# Patient Record
Sex: Female | Born: 1975 | Race: White | Hispanic: Yes | Marital: Married | Smoking: Never smoker
Health system: Southern US, Community
[De-identification: ages and names within clinical notes are randomized; demographics above are authoritative.]

## PROBLEM LIST (undated history)

## (undated) DIAGNOSIS — O24419 Gestational diabetes mellitus in pregnancy, unspecified control: Secondary | ICD-10-CM

---

## 1999-03-01 ENCOUNTER — Emergency Department (HOSPITAL_COMMUNITY): Admission: EM | Admit: 1999-03-01 | Discharge: 1999-03-01 | Payer: Self-pay | Admitting: Emergency Medicine

## 1999-03-27 ENCOUNTER — Other Ambulatory Visit: Admission: RE | Admit: 1999-03-27 | Discharge: 1999-03-27 | Payer: Self-pay | Admitting: Gynecology

## 1999-10-10 ENCOUNTER — Inpatient Hospital Stay (HOSPITAL_COMMUNITY): Admission: AD | Admit: 1999-10-10 | Discharge: 1999-10-10 | Payer: Self-pay | Admitting: *Deleted

## 1999-10-11 ENCOUNTER — Inpatient Hospital Stay (HOSPITAL_COMMUNITY): Admission: AD | Admit: 1999-10-11 | Discharge: 1999-10-11 | Payer: Self-pay | Admitting: *Deleted

## 1999-10-12 ENCOUNTER — Inpatient Hospital Stay (HOSPITAL_COMMUNITY): Admission: AD | Admit: 1999-10-12 | Discharge: 1999-10-12 | Payer: Self-pay | Admitting: *Deleted

## 1999-10-12 ENCOUNTER — Inpatient Hospital Stay (HOSPITAL_COMMUNITY): Admission: AD | Admit: 1999-10-12 | Discharge: 1999-10-14 | Payer: Self-pay | Admitting: Obstetrics

## 2002-02-15 ENCOUNTER — Encounter: Admission: RE | Admit: 2002-02-15 | Discharge: 2002-02-15 | Payer: Self-pay | Admitting: Obstetrics & Gynecology

## 2002-04-14 ENCOUNTER — Inpatient Hospital Stay (HOSPITAL_COMMUNITY): Admission: AD | Admit: 2002-04-14 | Discharge: 2002-04-16 | Payer: Self-pay | Admitting: Obstetrics and Gynecology

## 2008-03-30 ENCOUNTER — Inpatient Hospital Stay (HOSPITAL_COMMUNITY): Admission: AD | Admit: 2008-03-30 | Discharge: 2008-03-30 | Payer: Self-pay | Admitting: Obstetrics & Gynecology

## 2008-04-11 ENCOUNTER — Encounter: Payer: Self-pay | Admitting: Obstetrics & Gynecology

## 2008-04-11 ENCOUNTER — Ambulatory Visit (HOSPITAL_COMMUNITY): Admission: RE | Admit: 2008-04-11 | Discharge: 2008-04-11 | Payer: Self-pay | Admitting: Obstetrics & Gynecology

## 2010-08-27 NOTE — Op Note (Signed)
NAME:  Gabriela Pierce, Gabriela Pierce              ACCOUNT NO.:  [id number]   MEDICAL RECORD NO.:  [id number]          PATIENT TYPE:  AMB   LOCATION:  SDC                           FACILITY:  [facility]   PHYSICIAN:  Roseanna Rainbow, M.D.DATE OF BIRTH:  09/08/1975   DATE OF PROCEDURE:  04/11/2008  DATE OF DISCHARGE:                               OPERATIVE REPORT   PREOPERATIVE DIAGNOSIS:  Missed abortion.   POSTOPERATIVE DIAGNOSIS:  Missed abortion.   PROCEDURE:  Suction, dilatation, and curettage.   SURGEON:  Roseanna Rainbow, MD   ANESTHESIA:  Managed anesthesia care, paracervical block.   PATHOLOGY:  Products of conception.   ESTIMATED BLOOD LOSS:  Minimal.   COMPLICATIONS:  None.   PROCEDURE:  The patient was taken to the operating room with an IV  running.  She was placed in the dorsal lithotomy position and prepped  and draped in the usual sterile fashion.  After a time-out had been  completed, a sterile speculum was then placed.  The anterior lip of the  cervix was then infiltrated with 2 mL of 1% Xylocaine.  The anterior lip  of the cervix was then grasped with a single-tooth tenaculum.  A 4 mL of  1% Xylocaine were then injected at 4 and 7 o'clock to produce a  paracervical block.  The cervix was then dilated with [facility] dilators.  A  7-mm suction curette was then advanced into the uterus.  The device was  activated and rotated to evacuate the uterus of the products of  conception.  A sharp curettage was then performed.  A final pass was  then made with the suction curette.  The tenaculum was then removed with  minimal bleeding noted from the cervix.  At the close of the procedure,  the instrument and pack counts were said to be correct x2.  The patient  was taken to the PACU awake and in stable condition.      Roseanna Rainbow, M.D.  Electronically Signed     LAJ/MEDQ  D:  04/11/2008  T:  04/12/2008  Job:  [id number]

## 2010-10-29 LAB — RPR: RPR: NONREACTIVE

## 2010-10-29 LAB — GC/CHLAMYDIA PROBE AMP, GENITAL: Chlamydia: NEGATIVE

## 2010-10-29 LAB — ANTIBODY SCREEN: Antibody Screen: NEGATIVE

## 2010-10-29 LAB — HIV ANTIBODY (ROUTINE TESTING W REFLEX): HIV: NONREACTIVE

## 2011-01-16 LAB — CBC
MCHC: 34 g/dL (ref 30.0–36.0)
RBC: 4.88 MIL/uL (ref 3.87–5.11)
RDW: 12.9 % (ref 11.5–15.5)

## 2011-01-16 LAB — URINE MICROSCOPIC-ADD ON

## 2011-01-16 LAB — URINALYSIS, ROUTINE W REFLEX MICROSCOPIC
Bilirubin Urine: NEGATIVE
Glucose, UA: NEGATIVE mg/dL
Ketones, ur: NEGATIVE mg/dL
Protein, ur: NEGATIVE mg/dL
pH: 5.5 (ref 5.0–8.0)

## 2011-01-16 LAB — POCT PREGNANCY, URINE: Preg Test, Ur: POSITIVE

## 2011-01-16 LAB — ABO/RH: ABO/RH(D): O POS

## 2011-03-28 LAB — HIV ANTIBODY (ROUTINE TESTING W REFLEX): HIV: NONREACTIVE

## 2011-03-28 LAB — RPR: RPR: NONREACTIVE

## 2011-04-15 NOTE — L&D Delivery Note (Signed)
Delivery Note At 8:35 AM a viable female was delivered via Vaginal, Spontaneous Delivery (Presentation: Left Occiput Anterior).  APGAR: 9, 9; weight 7 lb 9.5 oz (3445 g).   Placenta status: Intact, Spontaneous.  Cord: 3 vessels with the following complications: Knot.  Cord pH: none  Anesthesia: Epidural  Episiotomy:  Lacerations:  Suture Repair: 2.0 chromic Est. Blood Loss (mL):   Mom to postpartum.  Baby to nursery-stable.  Uzziel Russey A 06/18/2011, 9:07 AM

## 2011-05-26 LAB — STREP B DNA PROBE: GBS: NEGATIVE

## 2011-06-18 ENCOUNTER — Encounter (HOSPITAL_COMMUNITY): Payer: Self-pay | Admitting: Anesthesiology

## 2011-06-18 ENCOUNTER — Encounter (HOSPITAL_COMMUNITY): Payer: Self-pay | Admitting: *Deleted

## 2011-06-18 ENCOUNTER — Inpatient Hospital Stay (HOSPITAL_COMMUNITY): Payer: Medicaid Other | Admitting: Anesthesiology

## 2011-06-18 ENCOUNTER — Inpatient Hospital Stay (HOSPITAL_COMMUNITY)
Admission: AD | Admit: 2011-06-18 | Discharge: 2011-06-20 | DRG: 775 | Disposition: A | Discharge status: Home Health | Payer: Medicaid Other | Source: Ambulatory Visit | Attending: Obstetrics | Admitting: Obstetrics

## 2011-06-18 DIAGNOSIS — O99814 Abnormal glucose complicating childbirth: Principal | ICD-10-CM | POA: Diagnosis present

## 2011-06-18 HISTORY — DX: Gestational diabetes mellitus in pregnancy, unspecified control: O24.419

## 2011-06-18 LAB — CBC
Hemoglobin: 11.3 g/dL — ABNORMAL LOW (ref 12.0–15.0)
MCH: 24 pg — ABNORMAL LOW (ref 26.0–34.0)
MCHC: 32.3 g/dL (ref 30.0–36.0)
Platelets: 239 10*3/uL (ref 150–400)
RBC: 4.7 MIL/uL (ref 3.87–5.11)

## 2011-06-18 LAB — RPR: RPR Ser Ql: NONREACTIVE

## 2011-06-18 MED ORDER — EPHEDRINE 5 MG/ML INJ
10.0000 mg | INTRAVENOUS | Status: DC | PRN
  Filled 2011-06-18: qty 4

## 2011-06-18 MED ORDER — OXYCODONE-ACETAMINOPHEN 5-325 MG PO TABS: 1.0000 | ORAL_TABLET | ORAL | Status: DC | PRN

## 2011-06-18 MED ORDER — LIDOCAINE HCL (PF) 1 % IJ SOLN
30.0000 mL | INTRAMUSCULAR | Status: DC | PRN
  Administered 2011-06-18: 30 mL via SUBCUTANEOUS
  Filled 2011-06-18: qty 30

## 2011-06-18 MED ORDER — OXYTOCIN 20 UNITS IN LACTATED RINGERS INFUSION - SIMPLE: 125.0000 mL/h | INTRAVENOUS | Status: DC | PRN

## 2011-06-18 MED ORDER — ZOLPIDEM TARTRATE 5 MG PO TABS: 5.0000 mg | ORAL_TABLET | Freq: Every evening | ORAL | Status: DC | PRN

## 2011-06-18 MED ORDER — BENZOCAINE-MENTHOL 20-0.5 % EX AERO
INHALATION_SPRAY | CUTANEOUS | Status: AC
  Filled 2011-06-18: qty 56

## 2011-06-18 MED ORDER — ACETAMINOPHEN 325 MG PO TABS: 650.0000 mg | ORAL_TABLET | ORAL | Status: DC | PRN

## 2011-06-18 MED ORDER — OXYTOCIN 20 UNITS IN LACTATED RINGERS INFUSION - SIMPLE
125.0000 mL/h | Freq: Once | INTRAVENOUS | Status: AC
  Administered 2011-06-18: 999 mL/h via INTRAVENOUS

## 2011-06-18 MED ORDER — LANOLIN HYDROUS EX OINT: TOPICAL_OINTMENT | CUTANEOUS | Status: DC | PRN

## 2011-06-18 MED ORDER — ONDANSETRON HCL 4 MG/2ML IJ SOLN: 4.0000 mg | Freq: Four times a day (QID) | INTRAMUSCULAR | Status: DC | PRN

## 2011-06-18 MED ORDER — ONDANSETRON HCL 4 MG/2ML IJ SOLN: 4.0000 mg | INTRAMUSCULAR | Status: DC | PRN

## 2011-06-18 MED ORDER — OXYTOCIN 10 UNIT/ML IJ SOLN
INTRAMUSCULAR | Status: AC
  Administered 2011-06-18: 20 [IU]
  Filled 2011-06-18: qty 2

## 2011-06-18 MED ORDER — OXYTOCIN 20 UNITS IN LACTATED RINGERS INFUSION - SIMPLE: 125.0000 mL/h | Freq: Once | INTRAVENOUS | Status: DC

## 2011-06-18 MED ORDER — PHENYLEPHRINE 40 MCG/ML (10ML) SYRINGE FOR IV PUSH (FOR BLOOD PRESSURE SUPPORT)
80.0000 ug | PREFILLED_SYRINGE | INTRAVENOUS | Status: DC | PRN
  Filled 2011-06-18: qty 5

## 2011-06-18 MED ORDER — FLEET ENEMA 7-19 GM/118ML RE ENEM: 1.0000 | ENEMA | RECTAL | Status: DC | PRN

## 2011-06-18 MED ORDER — TETANUS-DIPHTH-ACELL PERTUSSIS 5-2.5-18.5 LF-MCG/0.5 IM SUSP: 0.5000 mL | Freq: Once | INTRAMUSCULAR | Status: DC

## 2011-06-18 MED ORDER — SENNOSIDES-DOCUSATE SODIUM 8.6-50 MG PO TABS
2.0000 | ORAL_TABLET | Freq: Every day | ORAL | Status: DC
  Administered 2011-06-18 – 2011-06-19 (×2): 2 via ORAL

## 2011-06-18 MED ORDER — DIPHENHYDRAMINE HCL 50 MG/ML IJ SOLN: 12.5000 mg | INTRAMUSCULAR | Status: DC | PRN

## 2011-06-18 MED ORDER — IBUPROFEN 600 MG PO TABS: 600.0000 mg | ORAL_TABLET | Freq: Four times a day (QID) | ORAL | Status: DC | PRN

## 2011-06-18 MED ORDER — FENTANYL 2.5 MCG/ML BUPIVACAINE 1/10 % EPIDURAL INFUSION (WH - ANES)
INTRAMUSCULAR | Status: DC | PRN
  Administered 2011-06-18: 13 mL/h via EPIDURAL

## 2011-06-18 MED ORDER — SIMETHICONE 80 MG PO CHEW: 80.0000 mg | CHEWABLE_TABLET | ORAL | Status: DC | PRN

## 2011-06-18 MED ORDER — CITRIC ACID-SODIUM CITRATE 334-500 MG/5ML PO SOLN: 30.0000 mL | ORAL | Status: DC | PRN

## 2011-06-18 MED ORDER — EPHEDRINE 5 MG/ML INJ: 10.0000 mg | INTRAVENOUS | Status: DC | PRN

## 2011-06-18 MED ORDER — DIBUCAINE 1 % RE OINT: 1.0000 "application " | TOPICAL_OINTMENT | RECTAL | Status: DC | PRN

## 2011-06-18 MED ORDER — MEDROXYPROGESTERONE ACETATE 150 MG/ML IM SUSP: 150.0000 mg | INTRAMUSCULAR | Status: DC | PRN

## 2011-06-18 MED ORDER — WITCH HAZEL-GLYCERIN EX PADS: 1.0000 "application " | MEDICATED_PAD | CUTANEOUS | Status: DC | PRN

## 2011-06-18 MED ORDER — FENTANYL 2.5 MCG/ML BUPIVACAINE 1/10 % EPIDURAL INFUSION (WH - ANES)
14.0000 mL/h | INTRAMUSCULAR | Status: DC
  Filled 2011-06-18: qty 60

## 2011-06-18 MED ORDER — PHENYLEPHRINE 40 MCG/ML (10ML) SYRINGE FOR IV PUSH (FOR BLOOD PRESSURE SUPPORT): 80.0000 ug | PREFILLED_SYRINGE | INTRAVENOUS | Status: DC | PRN

## 2011-06-18 MED ORDER — DIPHENHYDRAMINE HCL 25 MG PO CAPS: 25.0000 mg | ORAL_CAPSULE | Freq: Four times a day (QID) | ORAL | Status: DC | PRN

## 2011-06-18 MED ORDER — PRENATAL MULTIVITAMIN CH
1.0000 | ORAL_TABLET | Freq: Every day | ORAL | Status: DC
  Administered 2011-06-19 – 2011-06-20 (×2): 1 via ORAL
  Filled 2011-06-18 (×2): qty 1

## 2011-06-18 MED ORDER — IBUPROFEN 600 MG PO TABS
600.0000 mg | ORAL_TABLET | Freq: Four times a day (QID) | ORAL | Status: DC
  Administered 2011-06-18 – 2011-06-20 (×8): 600 mg via ORAL
  Filled 2011-06-18 (×8): qty 1

## 2011-06-18 MED ORDER — OXYTOCIN BOLUS FROM INFUSION
500.0000 mL | Freq: Once | INTRAVENOUS | Status: DC
  Filled 2011-06-18: qty 1000
  Filled 2011-06-18: qty 500

## 2011-06-18 MED ORDER — LIDOCAINE HCL (PF) 1 % IJ SOLN
INTRAMUSCULAR | Status: DC | PRN
  Administered 2011-06-18: 4 mL
  Administered 2011-06-18: 3 mL

## 2011-06-18 MED ORDER — LACTATED RINGERS IV SOLN
500.0000 mL | INTRAVENOUS | Status: DC | PRN
  Administered 2011-06-18: 500 mL via INTRAVENOUS

## 2011-06-18 MED ORDER — METHYLERGONOVINE MALEATE 0.2 MG/ML IJ SOLN: 0.2000 mg | INTRAMUSCULAR | Status: DC | PRN

## 2011-06-18 MED ORDER — ONDANSETRON HCL 4 MG PO TABS: 4.0000 mg | ORAL_TABLET | ORAL | Status: DC | PRN

## 2011-06-18 MED ORDER — BENZOCAINE-MENTHOL 20-0.5 % EX AERO: 1.0000 "application " | INHALATION_SPRAY | CUTANEOUS | Status: DC | PRN

## 2011-06-18 MED ORDER — OXYCODONE-ACETAMINOPHEN 5-325 MG PO TABS
1.0000 | ORAL_TABLET | ORAL | Status: DC | PRN
  Administered 2011-06-18 – 2011-06-20 (×5): 1 via ORAL
  Filled 2011-06-18 (×5): qty 1

## 2011-06-18 MED ORDER — LACTATED RINGERS IV SOLN
INTRAVENOUS | Status: DC
  Administered 2011-06-18: 06:00:00 via INTRAVENOUS
  Administered 2011-06-18: 999 mL/h via INTRAVENOUS

## 2011-06-18 MED ORDER — METHYLERGONOVINE MALEATE 0.2 MG PO TABS: 0.2000 mg | ORAL_TABLET | ORAL | Status: DC | PRN

## 2011-06-18 MED ORDER — LACTATED RINGERS IV SOLN: 500.0000 mL | Freq: Once | INTRAVENOUS | Status: DC

## 2011-06-18 NOTE — Progress Notes (Signed)
Pt states she had a gush of clear fluid at 0415 and then started contracting

## 2011-06-18 NOTE — Anesthesia Postprocedure Evaluation (Signed)
  Anesthesia Post-op Note  Patient: Gabriela Pierce  Procedure(s) Performed: * Lumbar Epidural for L&D *  Patient Location: Mother/Baby  Anesthesia Type: Epidural  Level of Consciousness: awake, alert  and oriented  Airway and Oxygen Therapy: Patient Spontanous Breathing  Post-op Pain: none  Post-op Assessment: Post-op Vital signs reviewed, Patient's Cardiovascular Status Stable, Respiratory Function Stable, Patent Airway, No signs of Nausea or vomiting, Adequate PO intake, Pain level controlled, No headache and No backache  Post-op Vital Signs: Reviewed and stable  Complications: No apparent anesthesia complications

## 2011-06-18 NOTE — Anesthesia Preprocedure Evaluation (Signed)
Anesthesia Evaluation  Patient identified by MRN, date of birth, ID band Patient awake    Reviewed: Allergy & Precautions, H&P , Patient's Chart, lab work & pertinent test results  Airway Mallampati: III TM Distance: >3 FB Neck ROM: full    Dental No notable dental hx. (+) Teeth Intact   Pulmonary neg pulmonary ROS,  breath sounds clear to auscultation  Pulmonary exam normal       Cardiovascular negative cardio ROS  Rhythm:regular Rate:Normal     Neuro/Psych negative neurological ROS  negative psych ROS   GI/Hepatic negative GI ROS, Neg liver ROS,   Endo/Other  Diabetes mellitus-, Well Controlled, Gestational  Renal/GU negative Renal ROS  negative genitourinary   Musculoskeletal   Abdominal Normal abdominal exam  (+)   Peds  Hematology negative hematology ROS (+)   Anesthesia Other Findings   Reproductive/Obstetrics (+) Pregnancy                           Anesthesia Physical Anesthesia Plan  ASA: II  Anesthesia Plan: Epidural   Post-op Pain Management:    Induction:   Airway Management Planned:   Additional Equipment:   Intra-op Plan:   Post-operative Plan:   Informed Consent: I have reviewed the patients History and Physical, chart, labs and discussed the procedure including the risks, benefits and alternatives for the proposed anesthesia with the patient or authorized representative who has indicated his/her understanding and acceptance.     Plan Discussed with: Anesthesiologist and Surgeon  Anesthesia Plan Comments:         Anesthesia Quick Evaluation

## 2011-06-18 NOTE — H&P (Signed)
Gabriela Pierce is a 36 y.o. female presenting for SROM and UC's. Maternal Medical History:  Reason for admission: Reason for admission: rupture of membranes and contractions.  Contractions: Onset was 3-5 hours ago.   Frequency: regular.    Fetal activity: Perceived fetal activity is normal.   Last perceived fetal movement was within the past hour.    Prenatal complications: no prenatal complications   OB History    Grav Para Term Preterm Abortions TAB SAB Ect Mult Living   4 2   1  1   2      Past Medical History  Diagnosis Date  . Gestational diabetes    History reviewed. No pertinent past surgical history. Family History: family history includes Anesthesia problems in her sister. Social History:  reports that she has never smoked. She has never used smokeless tobacco. She reports that she does not drink alcohol or use illicit drugs.  Review of Systems  All other systems reviewed and are negative.    Dilation: 10 Effacement (%): 100 Station: 0 Exam by:: r. gagnonr,c Blood pressure 113/75, pulse 68, temperature 97.7 F (36.5 C), temperature source Oral, resp. rate 20, height 5\' 2"  (1.575 m), weight 150 lb (68.04 kg), SpO2 98.00%. Maternal Exam:  Uterine Assessment: Contraction strength is firm.  Contraction duration is 1 minute. Abdomen: Patient reports no abdominal tenderness. Fetal presentation: vertex  Introitus: Normal vulva. Normal vagina.    Physical Exam  Nursing note and vitals reviewed. Constitutional: She is oriented to person, place, and time. She appears well-developed and well-nourished.  Eyes: Pupils are equal, round, and reactive to light.  Neck: Normal range of motion. Neck supple.  Cardiovascular: Normal rate and regular rhythm.   Respiratory: Effort normal.  GI: Soft.  Genitourinary: Vagina normal and uterus normal.  Musculoskeletal: Normal range of motion.  Neurological: She is alert and oriented to person, place, and time.  Skin: Skin is  warm and dry.  Psychiatric: She has a normal mood and affect. Her behavior is normal. Judgment and thought content normal.    Prenatal labs: ABO, Rh: O/Positive/-- (07/17 0000) Antibody: Negative (07/17 0000) Rubella:   RPR: Nonreactive (12/14 0000)  HBsAg: Negative (07/17 0000)  HIV: Non-reactive (12/14 0000)  GBS: Negative (02/11 0000)   Assessment/Plan: 39 weeks.  Active labor.  Expectant management.   Gabriela Pierce A 06/18/2011, 8:12 AM

## 2011-06-18 NOTE — Anesthesia Procedure Notes (Signed)
Epidural Patient location during procedure: OB Start time: 06/18/2011 6:42 AM  Staffing Anesthesiologist: Malen Gauze, Murray Durrell A. Performed by: anesthesiologist   Preanesthetic Checklist Completed: patient identified, site marked, surgical consent, pre-op evaluation, timeout performed, IV checked, risks and benefits discussed and monitors and equipment checked  Epidural Patient position: sitting Prep: site prepped and draped and DuraPrep Patient monitoring: continuous pulse ox and blood pressure Approach: midline Injection technique: LOR air  Needle:  Needle type: Tuohy  Needle gauge: 17 G Needle length: 9 cm Needle insertion depth: 5 cm cm Catheter type: closed end flexible Catheter size: 19 Gauge Catheter at skin depth: 10 cm Test dose: negative  Assessment Events: blood not aspirated, injection not painful, no injection resistance, negative IV test and no paresthesia

## 2011-06-18 NOTE — Progress Notes (Signed)
UR chart review completed.  

## 2011-06-18 NOTE — Progress Notes (Signed)
Gabriela Pierce is a 36 y.o. [phone] at [redacted]w[redacted]d by LMP admitted for active labor  Subjective:   Objective: BP 113/75  Pulse 68  Temp(Src) 97.7 F (36.5 C) (Oral)  Resp 20  Ht 5\' 2"  (1.575 m)  Wt 150 lb (68.04 kg)  BMI 27.44 kg/m2  SpO2 98%   Total I/O In: -  Out: 100 [Urine:100]  FHT:  FHR: 150 bpm, variability: moderate,  accelerations:  Present,  decelerations:  Absent UC:   regular, every 3 minutes SVE:   Dilation: 10 Effacement (%): 100 Station: 0 Exam by:: r. gagnonr,c  Labs: Lab Results  Component Value Date   WBC 5.0 06/18/2011   HGB 11.3* 06/18/2011   HCT 35.0* 06/18/2011   MCV 74.5* 06/18/2011   PLT 239 06/18/2011    Assessment / Plan: Spontaneous labor, progressing normally  Labor: Progressing normally Preeclampsia:  n/a Fetal Wellbeing:  Category I Pain Control:  Epidural I/D:  n/a Anticipated MOD:  NSVD  Gabriela Pierce A 06/18/2011, 8:16 AM

## 2011-06-19 LAB — CBC
HCT: 33.8 % — ABNORMAL LOW (ref 36.0–46.0)
Hemoglobin: 10.8 g/dL — ABNORMAL LOW (ref 12.0–15.0)
MCHC: 32 g/dL (ref 30.0–36.0)
RBC: 4.52 MIL/uL (ref 3.87–5.11)

## 2011-06-19 NOTE — Progress Notes (Signed)
Post Partum Day 1 Subjective: no complaints  Objective: Blood pressure 111/75, pulse 71, temperature 97.6 F (36.4 C), temperature source Oral, resp. rate 20, height 5\' 2"  (1.575 m), weight 150 lb (68.04 kg), SpO2 100.00%, unknown if currently breastfeeding.  Physical Exam:  General: alert and no distress Lochia: appropriate Uterine Fundus: firm Incision: healing well DVT Evaluation: No evidence of DVT seen on physical exam.   Basename 06/19/11 0511 06/18/11 0545  HGB 10.8* 11.3*  HCT 33.8* 35.0*    Assessment/Plan: Plan for discharge tomorrow   LOS: 1 day   Preslynn Bier A 06/19/2011, 8:58 AM

## 2011-06-20 MED ORDER — MEDROXYPROGESTERONE ACETATE 150 MG/ML IM SUSP: 150.0000 mg | INTRAMUSCULAR | Status: DC

## 2011-06-20 MED ORDER — IBUPROFEN 600 MG PO TABS: 600.0000 mg | ORAL_TABLET | Freq: Four times a day (QID) | ORAL | Status: DC

## 2011-06-20 MED ORDER — OXYCODONE-ACETAMINOPHEN 5-325 MG PO TABS: 1.0000 | ORAL_TABLET | ORAL | Status: AC | PRN

## 2011-06-20 NOTE — Discharge Summary (Signed)
Obstetric Discharge Summary Reason for Admission: onset of labor Prenatal Procedures: ultrasound Intrapartum Procedures: spontaneous vaginal delivery Postpartum Procedures: none Complications-Operative and Postpartum: none Hemoglobin  Date Value Range Status  06/19/2011 10.8* 12.0-15.0 (g/dL) Final     HCT  Date Value Range Status  06/19/2011 33.8* 36.0-46.0 (%) Final    Discharge Diagnoses: Term Pregnancy-delivered  Discharge Information: Date: 06/20/2011 Activity: pelvic rest Diet: routine Medications: None Condition: stable Instructions: refer to practice specific booklet Discharge to: home Follow-up Information    Follow up with Cledis Sohn A, MD. Schedule an appointment as soon as possible for a visit in 6 weeks.   Contact information:   [address] [place] [address] [phone]          Newborn Data: Live born female  Birth Weight: 7 lb 9.5 oz (3445 g) APGAR: 9, 9  Home with mother.  Edom Schmuhl A 06/20/2011, 9:16 AM

## 2011-06-20 NOTE — Progress Notes (Signed)
Post Partum Day 2 Subjective: no complaints  Objective: Blood pressure 103/67, pulse 64, temperature 97.4 F (36.3 C), temperature source Oral, resp. rate 16, height 5\' 2"  (1.575 m), weight 150 lb (68.04 kg), SpO2 100.00%, unknown if currently breastfeeding.  Physical Exam:  General: alert and no distress Lochia: appropriate Uterine Fundus: firm Incision: healing well DVT Evaluation: No evidence of DVT seen on physical exam.   Basename 06/19/11 0511 06/18/11 0545  HGB 10.8* 11.3*  HCT 33.8* 35.0*    Assessment/Plan: Discharge home   LOS: 2 days   Mykael Trott A 06/20/2011, 9:11 AM

## 2013-04-27 ENCOUNTER — Ambulatory Visit: Payer: Medicaid Other | Attending: Internal Medicine | Admitting: Internal Medicine

## 2013-04-27 ENCOUNTER — Encounter: Payer: Self-pay | Admitting: Internal Medicine

## 2013-04-27 VITALS — BP 112/73 | HR 76 | Temp 98.6°F | Resp 16 | Ht 60.0 in | Wt 135.0 lb

## 2013-04-27 DIAGNOSIS — Z0000 Encounter for general adult medical examination without abnormal findings: Secondary | ICD-10-CM | POA: Insufficient documentation

## 2013-04-27 DIAGNOSIS — Z23 Encounter for immunization: Secondary | ICD-10-CM

## 2013-04-27 NOTE — Progress Notes (Signed)
Pt is here to establish care.  Pt is requesting a physical and a pap smear.  

## 2013-04-27 NOTE — Progress Notes (Signed)
Patient ID: Gabriela Pierce, female   DOB: [birth year], 38 y.o.   MRN: [id number]  CC: New patient  HPI: 38 year old female with no significant past medical history who presented to clinic needing a referral for GYN and dentist. She has no complaints of chest pain or shortness of breath. No abdominal pain. No fever or chills.  No Known Allergies Past Medical History  Diagnosis Date  . Gestational diabetes    Current Outpatient Prescriptions on File Prior to Visit  Medication Sig Dispense Refill  . ibuprofen (ADVIL,MOTRIN) 600 MG tablet Take 1 tablet (600 mg total) by mouth every 6 (six) hours.  30 tablet  5  . medroxyPROGESTERone (DEPO-PROVERA) 150 MG/ML injection Inject 1 mL (150 mg total) into the muscle every 3 (three) months.  1 mL  3  . Prenatal Vit-Fe Fumarate-FA (PRENATAL MULTIVITAMIN) TABS Take 1 tablet by mouth daily.       No current facility-administered medications on file prior to visit.   Family History  Problem Relation Age of Onset  . Anesthesia problems Sister    History   Social History  . Marital Status: Married    Spouse Name: N/A    Number of Children: N/A  . Years of Education: N/A   Occupational History  . Not on file.   Social History Main Topics  . Smoking status: Never Smoker   . Smokeless tobacco: Never Used  . Alcohol Use: 0.0 oz/week    .5 drink(s) per week  . Drug Use: No  . Sexual Activity: Yes   Other Topics Concern  . Not on file   Social History Narrative  . No narrative on file    Review of Systems  Constitutional: Negative for fever, chills, diaphoresis, activity change, appetite change and fatigue.  HENT: Negative for ear pain, nosebleeds, congestion, facial swelling, rhinorrhea, neck pain, neck stiffness and ear discharge.   Eyes: Negative for pain, discharge, redness, itching and visual disturbance.  Respiratory: Negative for cough, choking, chest tightness, shortness of breath, wheezing and stridor.   Cardiovascular:  Negative for chest pain, palpitations and leg swelling.  Gastrointestinal: Negative for abdominal distention.  Genitourinary: Negative for dysuria, urgency, frequency, hematuria, flank pain, decreased urine volume, difficulty urinating and dyspareunia.  Musculoskeletal: Negative for back pain, joint swelling, arthralgias and gait problem.  Neurological: Negative for dizziness, tremors, seizures, syncope, facial asymmetry, speech difficulty, weakness, light-headedness, numbness and headaches.  Hematological: Negative for adenopathy. Does not bruise/bleed easily.  Psychiatric/Behavioral: Negative for hallucinations, behavioral problems, confusion, dysphoric mood, decreased concentration and agitation.    Objective:   Filed Vitals:   04/27/13 1632  BP: 112/73  Pulse: 76  Temp: 98.6 F (37 C)  Resp: 16    Physical Exam  Constitutional: Appears well-developed and well-nourished. No distress.  HENT: Normocephalic. External right and left ear normal. Oropharynx is clear and moist.  Eyes: Conjunctivae and EOM are normal. PERRLA, no scleral icterus.  Neck: Normal ROM. Neck supple. No JVD. No tracheal deviation. No thyromegaly.  CVS: RRR, S1/S2 +, no murmurs, no gallops, no carotid bruit.  Pulmonary: Effort and breath sounds normal, no stridor, rhonchi, wheezes, rales.  Abdominal: Soft. BS +,  no distension, tenderness, rebound or guarding.  Musculoskeletal: Normal range of motion. No edema and no tenderness.  Lymphadenopathy: No lymphadenopathy noted, cervical, inguinal. Neuro: Alert. Normal reflexes, muscle tone coordination. No cranial nerve deficit. Skin: Skin is warm and dry. No rash noted. Not diaphoretic. No erythema. No pallor.  Psychiatric: Normal mood and affect.  Behavior, judgment, thought content normal.   Lab Results  Component Value Date   WBC 7.5 06/19/2011   HGB 10.8* 06/19/2011   HCT 33.8* 06/19/2011   MCV 74.8* 06/19/2011   PLT 246 06/19/2011   No results found for this  basename: CREATININE, BUN, NA, K, CL, CO2    No results found for this basename: HGBA1C   Lipid Panel  No results found for this basename: chol, trig, hdl, cholhdl, vldl, ldlcalc       Assessment and plan:   Preventive health care - Referral provided to GYN and dentist

## 2013-04-27 NOTE — Patient Instructions (Signed)
Cuidados preventivos en las mujeres adultas  (Preventive Care for Adults, Female)  Un estilo de vida saludable y los cuidados preventivos pueden favorecer la salud y el bienestar. Las pautas de salud preventivas para las mujeres incluyen las siguientes prácticas clave:  · Un examen físico de rutina anual es un buen modo de controlar su salud y realizar estudios preventivos. Le da la posibilidad de compartir preocupaciones y conocer el estado de su salud, y que le realicen estudios completos.  · Consulte al dentista para realizar un examen de rutina y cuidados preventivos cada seis meses. Cepíllese los dientes al menos dos veces por día y pásese el hilo dental al menos una vez por día. Una buena higiene bucal evita caries y enfermedades de las encías.  · La frecuencia con que debe hacerse exámenes de la vista depende de la edad, el estado de salud, la historia familiar, el uso de lentes de contacto y otros factores. Siga las recomendaciones del médico para saber con qué frecuencia debe hacerse exámenes de la vista.  · Consuma una dieta saludable. Los alimentos que contienen vegetales, las frutas, los granos enteros, los productos lácteos bajos en grasas y las proteínas magras contienen nutrientes que son necesarios, sin consumir demasiadas calorías. Disminuya la ingesta de alimentos ricos en grasas sólidas, azúcares y sal agregadas. Consuma la cantidad de calorías adecuada para usted. Si es necesario, pídale información acerca de una dieta adecuada a su médico.  · La actividad física regular es una de las cosas más importantes que puede hacer por su salud. Los adultos deben hacer al menos 150 minutos de ejercicios de intensidad moderada (cualquier actividad que aumente la frecuencia cardíaca y lo haga transpirar) cada semana. Además, la mayoría de los adultos necesita ejercicios de fortalecimiento muscular dos o más días por semana.  · Mantenga un peso saludable. El índice de masa corporal (IMC) es una herramienta  que identifica posibles problemas con el peso. Proporciona una estimación de la grasa corporal basándose en el peso y la altura. El médico podrá determinar su IMC y ayudarlo a lograr o mantener un peso saludable. Para los adultos de 20 años o más:  · Un IMC menor de 18,5 se considera bajo peso.  · Un IMC entre 18,5 y 24,9 es normal.  · Un IMC entre 25 y 29,9 es sobrepeso.  · Un IMC de 30 o más se considera obesidad.  · Mantenga un nivel normal de lípidos y colesterol en sangre practicando actividad física y minimizando la ingesta de grasas saturadas. Consuma una dieta balanceada y saludable, e incluya variedad de frutas y vegetales. Los análisis de lípidos y colesterol en sangre deben comenzar a los 20 años y repetirse cada 5 años. Si los niveles de colesterol son altos, tiene más de 50 años o tiene riesgo elevado de sufrir enfermedades cardíacas, necesitará controlarse con más frecuencia. Si tiene niveles elevados de lípidos y colesterol, debe recibir tratamiento con medicamentos, si la dieta y el ejercicio no están funcionando.  · Si fuma, consulte con el médico acerca de las opciones para dejar de hacerlo. Si no fuma, no comience.  · Se recomienda realizarse exámenes de detección de cáncer de pulmón a personas adultas entre 55 y 80 años que estén en riesgo de desarrollar cáncer de pulmón por sus antecedentes de consumo de tabaco. Para quienes hayan fumado durante 30 años un paquete diario, y sigan fumando o hayan dejado el hábito en algún momento en los últimos 15 años, se recomienda realizarse una tomografía   computada de baja dosis de los pulmones todos los años. Fumar durante un año-paquete equivale a fumar un promedio de un paquete de cigarrillos diario durante un año (por ejemplo: un paquete por día durante 30 años o dos paquetes por día durante 15 años). Los exámenes anuales deben continuar hasta que el fumador haya dejado de fumar durante un mínimo de 15 años. Ya no deben realizarse en personas que tengan  un problema de salud que les impida recibir tratamiento para el cáncer de pulmón.  · Si está embarazada, no beba alcohol. Si está amamantando, beba alcohol con prudencia. Si no está embarazada y decide beber alcohol, no beba más de una medida por día. Se considera una medida a 12 onzas (355 ml) de cerveza, 5 onzas (148 ml) de vino, o 1,5 onzas (44 ml) de licor.  · Evite el consumo de drogas. No comparta agujas. Pida ayuda si necesita asistencia o instrucciones con respecto a abandonar el consumo de drogas.  · La hipertensión arterial causa enfermedades cardíacas y aumenta el riesgo de ictus. Debe controlar su presión arterial al menos cada uno o dos años. La hipertensión arterial que persiste debe tratarse con medicamentos si la pérdida de peso y el ejercicio no son efectivos.  · Si tiene entre 55 y 79 años, consulte a su médico si debe tomar aspirina para prevenir ictus.  · Los análisis para la diabetes incluyen la toma de una muestra de sangre para controlar el nivel de azúcar en la sangre durante el ayuno. Debe hacerlo cada tres años después de los 45 años si está dentro de su peso normal y sin factores de riesgo para la diabetes. Las pruebas deben comenzar a edades tempranas o llevarse a cabo con más frecuencia si tiene sobrepeso y al menos un factor de riesgo para la diabetes.  · Las evaluaciones para detectar el cáncer de mama son un método preventivo fundamental para las mujeres. Debe practicar la "autoconciencia de las mamas". Esto significa que debe reconocer la apariencia normal de sus mamas y como las siente y pudiendo incluir un autoexamen de mamas. Si detecta algún cambio, no importa cuán pequeño sea, debe informarlo a su médico. Las mujeres entre 20 y 30 años deben hacerse un examen clínico de las mamas como parte del examen regular de salud, cada uno a tres años. Después de los 40 años, deben hacerlo todos los años. A partir de los 40 años, deben hacerse una mamografía (radiografía de mamas) cada año.  Las mujeres con antecedentes familiares de cáncer de mama deben hablar con el médico para someterse a un estudio genético. Las que tienen más riesgo deben hacerse una resonancia magnética y una mamografía todos los años.  · La evaluación del riesgo de cáncer relacionado con el gen del cáncer de mama (BRCA) se recomienda a mujeres que tengan familiares con cáncer relacionado con el BRCA. Los cánceres relacionados con el BRCA incluyen el cáncer de mama, de ovario, de trompas y peritoneal. Tener familiares con estos cánceres puede estar asociado con un mayor riesgo de cambios dañinos (mutaciones) en los genes del cáncer de mama BRCA1 y BRCA2. Los resultados de la evaluación determinarán la necesidad de asesoramiento genético y de análisis de BRCA1 y BRCA2.  · Una prueba de Papanicolaou se realiza para diagnosticar cáncer de cuello del útero. Muestra los cambios celulares en el cuello del útero que pueden transformarse en cáncer si no se tratan. La prueba de Papanicolaou es un procedimiento por el que se obtienen células de la parte inferior del útero (cuello   del útero) y son examinadas.  · Las mujeres deben hacerse una prueba de Papanicolaou a partir de los 21 años.  · Entre los 21 y los 29 años debe repetirse cada dos años.  · Después de los 30 años, debe realizarse una prueba de Papanicolaou cada tres años siempre que los tres estudios anteriores sean normales.  · Algunas mujeres sufren problemas médicos que aumentan la probabilidad de contraer cáncer de cuello del útero. Hable con su médico sobre estos problemas. Es muy importante que le informe a su médico si aparecen nuevos problemas poco después de su última prueba de Papanicolaou. En estos casos, el médico podrá indicar que se realice esta prueba con más frecuencia.  · Estas recomendaciones son las mismas para todas las mujeres que hayan recibido o no la vacuna para el VPH (virus del papiloma humano).  · Si le han realizado una histerectomía por un problema que  no era cáncer u otra enfermedad que podría causar cáncer, ya no necesitará la prueba de Papanicolaou. Sin embargo, es una buena idea hacerse un examen regularmente para asegurarse de que no haya otros problemas.  · Si tiene entre 65 y 70 años y ha tenido un resultado normal en la prueba de Papanicolaou en los últimos 10 años, no será más necesario realizarla. Sin embargo, es una buena idea hacerse un examen regularmente para asegurarse de que no haya otros problemas.  · Si ha recibido un tratamiento para el cáncer cervical o para una enfermedad que podría causar cáncer, necesitará realizarse una prueba de Papanicolaou y controles durante al menos 20 años de concluido el tratamiento.  · Si no se ha hecho el examen con regularidad, deberán volver a evaluarse los factores de riesgo (como el tener un nuevo compañero sexual) para determinar si debe volver a realizarse los estudios.  · La prueba del VPH es un análisis adicional que puede usarse para detectar cáncer de cuello del útero. Esta prueba busca la presencia del virus que causa los cambios en el cuello. Las células que se recolectan durante la prueba de Papanicolaou pueden usarse para el VPH. La prueba para el VPH puede usarse para evaluar a mujeres de más de 30 años y debe usarse en mujeres de cualquier edad cuyos resultados de la prueba de Papanicolaou no sean claros. Después de los 30 años, las mujeres deben hacerse el análisis para el VPH con la misma frecuencia que la prueba de Papanicolaou.  · El cáncer colorectal puede detectarse y con frecuencia puede prevenirse. La mayor parte de los estudios de rutina comienzan a los 50 años y continúan hasta los 75 años. Sin embargo, el médico podrá aconsejarle que lo haga antes, si tiene factores de riesgo para el cáncer de colon. Una vez por año, el profesional le dará un kit de prueba para hallar sangre oculta en la materia fecal. La utilización de un tubo con una pequeña cámara en su extremo para examinar  directamente el colon (sigmoidoscopía o colonoscopía), puede detectar formas temprana de cáncer colorectal. Hable con su médico si tiene 50 años, edad a la que comienzan los estudios de rutina. El examen directo del colon debe repetirse cada cinco a 10 años, hasta los 75 años, excepto que se encuentren formas tempranas de pólipos precancerosos o pequeños bultos.  · Las personas con un riesgo mayor de hepatitis B deben realizarse análisis para detectar el virus. Se considera que tiene un alto riesgo de hepatitis B si:  · Nació en un país donde la hepatitis   B es frecuente. Pregúntele a su médico qué países son considerados de alto riesgo.  · Sus padres nacieron en un país de alto riesgo y usted no recibió una vacuna que proteja contra la hepatitis B (vacuna de la hepatitis B).  · Tiene VIH o SIDA.  · Usa agujas para inyectarse drogas ilegales.  · Vive o tiene sexo con alguien que tiene hepatitis B.  · Recibe tratamiento de hemodiálisis.  · Toma ciertos medicamentos para enfermedades como cáncer, trasplante de órganos y afecciones autoinmunes.  · Se recomienda realizar un análisis de sangre para detectar hepatitis C a todas las personas nacidas entre 1945 y 1965, y a todo aquel que tenga un riesgo conocido de haber contraído esta enfermedad.  · Practique el sexo seguro. Use condones y evite las prácticas sexuales riesgosas para disminuir el contagio de enfermedades de transmisión sexual. Las enfermedades de transmisión sexual son la gonorrea, clamidia, sífilis, tricomonas, herpes, VPH y el virus de inmunodeficiencia humana (VIH). El herpes, el VIH y el VPH son enfermedades virales que no tienen cura. Pueden producir discapacidad, cáncer y hasta la muerte. Las mujeres sexualmente activas de 25 años o menos deben ser evaluadas para detectar clamidia. Las mujeres mayores que tengan múltiples compañeros también deben hacerse el análisis para detectar clamidia. Se recomienda realizar análisis para detectar otras  enfermedades de transmisión sexual si es sexualmente activa y tiene riesgos.  · La osteoporosis es una enfermedad en la que los huesos pierden los minerales y la fuerza por el avance de la edad. El resultado pueden ser fracturas o quebraduras graves en los huesos. El riesgo de osteoporosis puede identificarse con una prueba de densidad ósea. Las mujeres de más de 65 años y las que tengan riesgos de sufrir fracturas u osteoporosis deben pedir consejo a su médico. Consulte a su médico si debe tomar un suplemento de calcio o de vitamina D para reducir el riesgo de osteoporosis.  · La menopausia se asocia a síntomas y riesgos físicos. Se dispone de una terapia de reemplazo hormonal para disminuir los síntomas y los riesgos. Consulte a su médico para saber si la terapia de reemplazo hormonal es conveniente para usted.  · Utilice pantalla solar. Aplique pantalla de manera libre y repetida a lo largo del día. Póngase al resguardo del sol cuando la sombra sea más pequeña que usted. Protéjase usando mangas y pantalones largos, un sombrero de ala ancha y gafas para el sol todo el año, siempre que se encuentre en el exterior.  · Una vez por mes hágase un examen de la piel de todo el cuerpo usando un espejo para ver la espalda. Informe al médico si aparecen nuevos lunares, o los que ya tenía tienen bordes irregulares, los que sean más grandes que una goma de lápiz o los que hayan cambiado su forma o color.  · Manténgase al día con las vacunas obligatorias (inmunizaciones).  · Vacuna antigripal. Todas las personas adultas deben vacunarse cada año.  · Vacuna contra la difteria, tétanos y pertusis acelular (DT, DTPa). Las mujeres embarazadas deben recibir una dosis de la vacuna DTPa en cada embarazo. Se debe recibir la dosis independientemente de cuánto tiempo haya transcurrido desde la última dosis. Es preferible vacunarse entre la semana 27 y 36 de la gestación. Una persona adulta que no haya recibido la vacuna DTPa  anteriormente o que no sabe su estado de vacunación debe recibir una dosis. Después de esta dosis inicial, sigue un refuerzo con toxoides de difteria y tétanos (DT) cada   10 años. Las personas adultas que no sepan o no hayan recibido la serie de vacunación de tres dosis con las vacunas que contienen DT deben iniciar o finalizar una serie de vacunación primaria, que incluye la dosis de DTPa. Las personas adultas deben recibir una dosis de refuerzo de DT cada 10 años.  · Vacuna contra la varicela. Una persona adulta sin prueba de inmunidad a la varicela debe recibir dos dosis o una segunda dosis si recibió una dosis previamente. Las embarazadas sin prueba de inmunidad deben recibir la primera dosis después del embarazo. Esta primera dosis se debe aplicar antes del alta del centro de salud. La segunda dosis debe aplicarse entre cuatro y ocho semanas posteriores a la primera dosis.  · Vacuna contra el virus del papiloma humano (VPH). Las mujeres entre 13 y 26 años que no hayan recibido la vacuna antes deben recibir la serie de tres dosis. La vacuna no se recomienda en mujeres embarazadas. Sin embargo, no es necesario realizar una prueba de embarazo antes de recibir una dosis. Si se descubre que una mujer está embarazada después de recibir la dosis, no se necesita tratamiento. En ese caso, las dosis restantes deben retrasarse hasta después del embarazo. Se recomienda la vacuna para cualquier persona inmunodeprimida hasta la edad de 26 años si no recibió alguna o ninguna de las dosis anteriormente. Durante la serie de tres dosis, la segunda dosis debe aplicarse entre las cuatro y ocho semanas posteriores a la primera dosis. La tercera dosis debe aplicarse 24 semanas después de la primera dosis y 16 semanas después de la segunda dosis.  · Vacuna contra el herpes zoster. Se recomienda una dosis en personas adultas mayores de 60 años a menos que sufran ciertas enfermedades.  · Vacuna contra sarampión, rubéola y paperas (SRP)  Los adultos nacidos antes de 1957 generalmente se consideran inmunes al sarampión y las paperas. Las personas nacidas en 1957 o más tarde deben recibir una o más dosis de la vacuna SRP, a menos que exista una contraindicación para la vacuna o que tengan prueba de inmunidad a las tres enfermedades. Se debe aplicar una segunda dosis de rutina de la vacuna SRP al menos 28 días después de la primera dosis a estudiantes de escuelas postsecundarias, trabajadores de la salud o viajeros internacionales. Las personas que recibieron la vacuna inactivada contra el sarampión o algún tipo desconocido de vacuna contra el sarampión entre los años 1963 y 1967 deben recibir dos dosis de la vacuna SRP. Las personas que recibieron la vacuna inactivada contra las paperas o algún tipo desconocido de vacuna contra las paperas antes de 1979 y tienen un alto riesgo de infectarse con la enfermedad deben considerar vacunarse con dos dosis de la vacuna SRP. En las mujeres en edad fértil, debe determinarse la inmunidad contra la rubéola. Si no hay prueba de inmunidad, las mujeres que no estén embarazadas deben vacunarse. Si no hay prueba de inmunidad, las mujeres que estén embarazadas deben retrasar la vacunación hasta después del embarazo. Los trabajadores de la salud no vacunados que nacieron antes de 1957 y no tienen prueba de inmunidad contra el sarampión, rubéola y paperas o no tienen confirmación de laboratorio de la enfermedad deben considerar vacunarse contra el sarampión y las paperas con dos dosis de la vacuna SRP, y contra la rubéola con una dosis de la vacuna SRP.  · Vacuna antineumocócica conjugada 13 valente (PCV13). Según indicación médica, una persona que no conozca su historia de vacunación y no tenga registro de vacunas debe   recibir la vacuna PCV13. Una persona de 19 años o más que tenga ciertas enfermedades y no se haya vacunado debe recibir una dosis de la vacuna PCV13. Después de esta vacuna, se debe aplicar una dosis de  la vacuna antineumocócica de polisacáridos (PPSV23). La dosis de la vacuna PPSV23 se debe recibir al menos ocho semanas después de la dosis de la vacuna PCV13. Una persona de 19 años o más que tenga ciertas enfermedades y haya recibido una o más dosis de la vacuna PPSV23 previamente debe recibir una dosis de la vacuna PCV13. La dosis de la vacuna PCV13 se debe aplicar un año o más después de la dosis de la vacuna PPSV23.  · Vacuna antineumocócica de polisacáridos (PPSV23). Primero se debe recibir la vacuna PCV13 si también esté indicada. Todas las personas de 65 años o mayores deben recibir la vacuna. Una persona menor de 65 años que tenga ciertas enfermedades se debe vacunar. Toda persona que viva en un hogar de ancianos o en un centro de atención durante mucho tiempo se debe vacunar. Un fumador adulto se debe vacunar. Las personas inmunodeprimidas o con otras enfermedades deben recibir ambas vacunas, PCV13 y PPSV23. Las personas infectadas con el virus de la inmunodeficiencia humana (VIH) deben recibir la vacuna lo antes posible después del diagnóstico. Se debe evitar la vacunación durante tratamientos de quimioterapia y radioterapia. El uso de rutina de la vacuna PPSV23 no está recomendado para indios americanos, personas nativas de [place] o menores de 65 años, salvo que tengan ciertas enfermedades que requieran la vacuna. Según indicación médica, las personas que no conozcan su historia de vacunación y no tengan registros de vacunas, deben recibir la vacuna PPSV23. Se recomienda una única revacunación cinco años después de recibir la primera dosis de PPSV23 para personas de 19 a 64 años con insuficiencia renal crónica, síndrome nefrótico, asplenia o inmunodepresión. Las personas que recibieron de una a dos dosis de PPSV23 antes de los 65 años deben recibir otra dosis de dicha vacuna a los 65 años de edad o posteriormente si pasaron cinco años, como mínimo, desde la dosis anterior. Las dosis de PPSV23 no son  necesarias para personas que ya recibieron la vacuna a los 65 años o posteriormente.  · Vacuna antimeningocócica. Los adultos con asplenia o con persistentes deficiencias de componentes terminales del complemento deben recibir dos dosis de la vacuna antimeningocócica conjugada tetravalente (MenACWY-D). Las dosis se deben aplicar con un mínimo de dos meses de separación. Deben vacunarse los microbiólogos que trabajan con ciertas bacterias meningocócicas, reclutas militares y personas que viajan o viven en países con una alta tasa de meningitis. Los estudiantes universitarios de primer año hasta la edad de 21 que vivan en una residencia estudiantil deben recibir una dosis si no se aplicaron la vacuna cuando cumplieron o después de cumplir 16 años. Las personas que sufren ciertas enfermedades de alto riesgo deben aplicarse una o más dosis.  · Vacuna contra la hepatitis A. Las personas que deseen estar protegidas contra esta enfermedad, que sufren ciertas enfermedades de alto riesgo, que trabajan con animales infectados con hepatitis A, que trabajan en los laboratorios de investigación de hepatitis A, o que viajan o trabajan en países con una alta tasa de hepatitis A deben recibir la vacuna. Los personas que no fueron vacunadas previamente y que van a tener un contacto cercano con una persona adoptada fuera del país, deben recibir la vacuna durante los primeros 60 días después de su llegada a los [place] desde un país con   una alta tasa de hepatitis A.  · Vacuna contra la hepatitis B. Las personas que deseen estar protegidas contra esta enfermedad, que sufren ciertas enfermedades de alto riesgo, que puedan estar expuestas a sangre u otros fluidos corporales infecciosos, que tienen contactos familiares o parejas sexuales con hepatitis B positivo, que sean clientes o trabajadores de ciertos centros de atención, o que viajan o trabajan en países con una alta tasa de hepatitis B deben recibir la vacuna.  · Vacuna  antihaemophilus influenzae tipo B (Hib). Una persona no vacunada previamente, que sufra de asplenia o de anemia drepanocítica, o que tenga una esplenectomía programada debe recibir una dosis de la vacuna Hib. Independientemente de la vacunación previa, un paciente trasplantado con células madre hematopoyéticas debe recibir una serie de tres dosis, de seis a 12 meses después del trasplante exitoso. La vacuna Hib no está recomendada para personas adultas infectadas con VIH.  Controles preventivos - Frecuencia  Ages 19 to 39years  · Control de la tensión arterial.**/Cada uno a dos años.  · Control de lípidos y colesterol.**/Cada cinco años a partir de los 20 años.  · Examen clínico de las mamas.**/Cada 3 años en las mujeres entre los 20 y los 30 años.  · Evaluación del riesgo de cáncer relacionado con el BRCA.**/Para mujeres que tienen familiares con cáncer relacionado con el BRCA (cáncer de mama, de ovario, de trompas y peritoneal).  · Prueba de Papanicolaou.**/Cada dos años entre los 21 y los 29 años. Cada tres años a partir de los 30 años y hasta los 65 o 70 años, con una historia de tres pruebas de Papanicolaou normales consecutivas.  · Pruebas de detección de VPH.**/Cada tres años, a partir de los 30 años y hasta los 65 o 70 años, con una historia de tres pruebas de Papanicolaou normales consecutivas.  · Análisis de sangre para la hepatitis C.**/Para toda persona con riesgos conocidos de hepatitis C.  · Autoexamen de piel. /Todos los meses.  · Vacuna antigripal. /Todos los años.  · Vacuna contra la difteria, tétanos y pertusis acelular (DT, DTPa).**/Consulte a su médico. Las mujeres embarazadas deben recibir una dosis de la vacuna DTPa en cada embarazo. Una dosis de DT cada 10 años.  · Vacuna contra la varicela.**/Consulte a su médico. Las embarazadas sin prueba de inmunidad deben recibir la primera dosis después del embarazo.  · Vacuna contra el virus del papiloma humano (VPH). /Tres dosis en el curso de seis  meses, si tiene 26 años o menos. La vacuna no se recomienda en mujeres embarazadas. Sin embargo, no es necesario realizar una prueba de embarazo antes de recibir una dosis.  · Vacuna contra el sarampión, rubéola y paperas (SRP).**/Debe aplicarse al menos una dosis de SRP si ha nacido en 1957 o después. Podría también necesitar una 2.da dosis. En las mujeres en edad fértil, debe determinarse la inmunidad contra la rubéola. Si no hay prueba de inmunidad, las mujeres que no estén embarazadas deben vacunarse. Si no hay prueba de inmunidad, las mujeres que estén embarazadas deben retrasar la vacunación hasta después del embarazo.  · Vacuna antineumocócica conjugada 13 valente (PCV13).**/Consulte a su médico.  · Vacuna antineumocócica de polisacáridos (PPSV23).**/De una a dos dosis si es fumador o si sufre ciertas enfermedades.  · Vacuna antimeningocócica.**/Si tiene entre 19 y 21 años y es estudiante universitario de primer año que vive en una residencia estudiantil o tiene alguna enfermedad grave, debe recibir una dosis de esta vacuna. Podría también necesitar dosis de refuerzo.  ·   Vacuna contra la hepatitis A.**/Consulte a su médico.  · Vacuna contra la hepatitis B.**/Consulte a su médico.  · Vacuna antihaemophilus influenzae tipo B (Hib).**/Consulte a su médico.  De 40 a 64años  · Control de la tensión arterial.**/Cada uno a dos años.  · Control de lípidos y colesterol.**/Cada cinco años a partir de los 20 años.  · Pruebas de detección de cáncer de pulmón. /Todos los años si tiene entre 55 y 80 años, y si ha fumado durante 30 años un paquete diario y sigue fumando o dejó el hábito en algún momento en los últimos 15 años. Los estudios de detección anuales se interrumpen cuando haya dejado de fumar durante al menos 15 años o si tiene un problema de salud que le impida recibir tratamiento para el cáncer de pulmón.  · Examen clínico de las mamas.**/Todos los año después de los 40 años.  · Evaluación del riesgo de cáncer  relacionado con el BRCA.**/Para mujeres que tienen familiares con cáncer relacionado con el BRCA (cáncer de mama, de ovario, de trompas y peritoneal).  · Mamografía.**/Una vez por año a partir de los 40 años en adelante siempre que tenga buena salud. Consulte a su médico.  · Prueba de Papanicolaou.**/Cada tres años después de los 30 años y hasta los 65 o 70 años, con una historia de tres pruebas de Papanicolaou normales consecutivas.  · Pruebas de detección de VPH.**/Cada tres años, a partir de los 30 años y hasta los 65 o 70 años, con una historia de tres pruebas de Papanicolaou normales consecutivas.  · Prueba de sangre oculta en materia fecal. /Cada año a partir de los 50 años hasta los 75 años. No tendrá que hacerlo si se realiza una colonoscopía cada 10 años.  · Colonoscopía o sigmoidoscopía flexible.**/Cada 5 años para la sigmoidoscopía flexible o cada 10 años para la colonoscopía, comenzando a los 50 años y continuando hasta los 75 años.  · Análisis de sangre para la hepatitis C.**/Para todas las personas nacidas entre 1945 y 1965, y a todo aquel que tenga un riesgo conocido de haber contraído esta enfermedad.  · Autoexamen de piel. /Todos los meses.  · Vacuna antigripal. /Todos los años.  · Vacuna contra la difteria, tétanos y pertusis acelular (DT, DTPa).**/Consulte a su médico. Las mujeres embarazadas deben recibir una dosis de la vacuna DTPa en cada embarazo. Una dosis de DT cada 10 años.  · Vacuna contra la varicela.**/Consulte a su médico. Las embarazadas sin prueba de inmunidad deben recibir la primera dosis después del embarazo.  · Vacuna contra el herpes zoster.**/Una dosis para adultos de 60 años o más.  · Vacuna contra el sarampión, rubéola y paperas (SRP).**/Debe aplicarse al menos una dosis de SRP si ha nacido en 1957 o después. Podría también necesitar una 2.da dosis. En las mujeres en edad fértil, debe determinarse la inmunidad contra la rubéola. Si no hay prueba de inmunidad, las mujeres que  no estén embarazadas deben vacunarse. Si no hay prueba de inmunidad, las mujeres que estén embarazadas deben retrasar la vacunación hasta después del embarazo.  · Vacuna antineumocócica conjugada 13 valente (PCV13).**/Consulte a su médico.  · Vacuna antineumocócica de polisacáridos (PPSV23).**/De una a dos dosis si es fumador o si sufre ciertas enfermedades.  · Vacuna antimeningocócica.**/Consulte a su médico.  · Vacuna contra la hepatitis A.**/Consulte a su médico.  · Vacuna contra la hepatitis B.**/Consulte a su médico.  · Vacuna antihaemophilus influenzae tipo B (Hib).**/Consulte a su médico.  Más de 65 años  ·   Control de la tensión arterial.**/Cada uno a dos años.  · Control de lípidos y colesterol.**/Cada cinco años a partir de los 20 años.  · Pruebas de detección de cáncer de pulmón. /Todos los años si tiene entre 55 y 80 años, y si ha fumado durante 30 años un paquete diario y sigue fumando o dejó el hábito en algún momento en los últimos 15 años. Los estudios de detección anuales se interrumpen cuando haya dejado de fumar durante al menos 15 años o si tiene un problema de salud que le impida recibir tratamiento para el cáncer de pulmón.  · Examen clínico de las mamas.**/Todos los año después de los 40 años.  · Evaluación del riesgo de cáncer relacionado con el BRCA.**/Para mujeres que tienen familiares con cáncer relacionado con el BRCA (cáncer de mama, de ovario, de trompas y peritoneal).  · Mamografía.**/Una vez por año a partir de los 40 años en adelante siempre que tenga buena salud. Consulte a su médico.  · Prueba de Papanicolaou.**/Cada tres años después de los 30 años y hasta los 65 o 70 años, con una historia de tres pruebas de Papanicolaou normales consecutivas. Las pruebas pueden interrumpirse entre los 65 y los 70 años, si tiene tres pruebas de Papanicolaou normales consecutivas y ninguna prueba de Papanicolaou ni de VPH anormal en los últimos 10 años.  · Pruebas de detección de VPH.**/Cada tres  años, a partir de los 30 años y hasta los 65 o 70 años, con una historia de tres pruebas de Papanicolaou normales consecutivas. Las pruebas pueden interrumpirse entre los 65 y los 70 años, si tiene tres pruebas de Papanicolaou normales consecutivas y ninguna prueba de Papanicolaou ni de VPH anormal en los últimos 10 años.  · Prueba de sangre oculta en materia fecal. /Cada año a partir de los 50 años hasta los 75 años. No tendrá que hacerlo si se realiza una colonoscopía cada 10 años.  · Colonoscopía o sigmoidoscopía flexible.**/Cada 5 años para la sigmoidoscopía flexible o cada 10 años para la colonoscopía, comenzando a los 50 años y continuando hasta los 75 años.  · Análisis de sangre para la hepatitis C.**/Para todas las personas nacidas entre 1945 y 1965, y a todo aquel que tenga un riesgo conocido de haber contraído esta enfermedad.  · Pruebas de detección de osteoporosis.**/Por única vez en mujeres de más de 65 años que tengan riesgo de fracturas u osteoporosis.  · Autoexamen de piel. /Todos los meses.  · Vacuna antigripal. /Todos los años.  · Vacuna contra la difteria, tétanos y pertusis acelular (DT, DTPa).**/Una dosis de DT cada 10 años.  · Vacuna contra la varicela.**/Consulte a su médico.  · Vacuna contra el herpes zoster.**/Una dosis para adultos de 60 años o más.  · Vacuna antineumocócica conjugada 13 valente (PCV13).**/Consulte a su médico.  · Vacuna antineumocócica de polisacáridos (PPSV23).**/Una dosis para todos los adultos de 65 años o más.  · Vacuna antimeningocócica.**/Consulte a su médico.  · Vacuna contra la hepatitis A.**/Consulte a su médico.  · Vacuna contra la hepatitis B.**/Consulte a su médico.  · Vacuna antihaemophilus influenzae tipo B (Hib).**/Consulte a su médico.  ** Los antecedentes familiares y personales de riesgos y enfermedades pueden cambiar las recomendaciones del médico.  Document Released: 01/08/2005 Document Revised: 01/19/2013  ExitCare® Patient Information ©2014 [employer],  LL[employer].

## 2013-04-28 ENCOUNTER — Ambulatory Visit: Payer: Medicaid Other | Admitting: Internal Medicine

## 2013-05-03 ENCOUNTER — Encounter: Payer: Self-pay | Admitting: Medical

## 2013-05-05 ENCOUNTER — Encounter: Payer: Self-pay | Admitting: Internal Medicine

## 2013-05-05 ENCOUNTER — Ambulatory Visit: Payer: Medicaid Other | Attending: Internal Medicine | Admitting: Internal Medicine

## 2013-05-05 VITALS — BP 120/81 | HR 87 | Temp 98.4°F | Resp 16 | Ht 60.0 in | Wt 134.0 lb

## 2013-05-05 DIAGNOSIS — N61 Mastitis without abscess: Secondary | ICD-10-CM

## 2013-05-05 DIAGNOSIS — K029 Dental caries, unspecified: Secondary | ICD-10-CM

## 2013-05-05 MED ORDER — AMOXICILLIN 500 MG PO CAPS: 500.0000 mg | ORAL_CAPSULE | Freq: Three times a day (TID) | ORAL | Status: DC

## 2013-05-05 MED ORDER — NAPROXEN 500 MG PO TABS: 500.0000 mg | ORAL_TABLET | Freq: Two times a day (BID) | ORAL | Status: DC

## 2013-05-05 NOTE — Progress Notes (Signed)
Patient ID: Gabriela Pierce, female   DOB: 09-24-1975, 38 y.o.   MRN: [id number] Patient Demographics  Gabriela Pierce, is a 38 y.o. female  YQM:[id number]  [id number]  DOB - [birth year]  Chief Complaint  Patient presents with  . Follow-up        Subjective:   Gabriela Pierce is a 38 y.o. female here today for a follow up visit. Patient has no significant medical history to me today complaining of pain in her right breast especially during breast-feeding and is an area of redness around the areolar. She has no fever.  Patient has No headache, No chest pain, No abdominal pain - No Nausea, No new weakness tingling or numbness, No Cough - SOB.  ALLERGIES: No Known Allergies  PAST MEDICAL HISTORY: Past Medical History  Diagnosis Date  . Gestational diabetes     MEDICATIONS AT HOME: Prior to Admission medications   Medication Sig Start Date End Date Taking? Authorizing Provider  Prenatal Vit-Fe Fumarate-FA (PRENATAL MULTIVITAMIN) TABS Take 1 tablet by mouth daily.   Yes Historical Provider, MD  amoxicillin (AMOXIL) 500 MG capsule Take 1 capsule (500 mg total) by mouth 3 (three) times daily. 05/05/13   Angelica Chessman, MD  ibuprofen (ADVIL,MOTRIN) 600 MG tablet Take 1 tablet (600 mg total) by mouth every 6 (six) hours. 06/20/11 06/30/11  Shelly Bombard, MD  medroxyPROGESTERone (DEPO-PROVERA) 150 MG/ML injection Inject 1 mL (150 mg total) into the muscle every 3 (three) months. 06/20/11   Shelly Bombard, MD  naproxen (NAPROSYN) 500 MG tablet Take 1 tablet (500 mg total) by mouth 2 (two) times daily with a meal. 05/05/13   Angelica Chessman, MD     Objective:   Filed Vitals:   05/05/13 1447  BP: 120/81  Pulse: 87  Temp: 98.4 F (36.9 C)  TempSrc: Oral  Resp: 16  Height: 5' (1.524 m)  Weight: 134 lb (60.782 kg)  SpO2: 96%    Exam General appearance : Awake, alert, not in any distress. Speech Clear. Not toxic looking HEENT: Atraumatic and Normocephalic,  pupils equally reactive to light and accomodation Neck: supple, no JVD. No cervical lymphadenopathy.  Chest: Right breast shows area of redness and tenderness around the areolar. No specific abscess. No lump. Good air entry bilaterally, no added sounds  CVS: S1 S2 regular, no murmurs.  Abdomen: Bowel sounds present, Non tender and not distended with no gaurding, rigidity or rebound. Extremities: B/L Lower Ext shows no edema, both legs are warm to touch Neurology: Awake alert, and oriented X 3, CN II-XII intact, Non focal Skin:No Rash Wounds:N/A   Data Review   CBC No results found for this basename: WBC, HGB, HCT, PLT, MCV, MCH, MCHC, RDW, NEUTRABS, LYMPHSABS, MONOABS, EOSABS, BASOSABS, BANDABS, BANDSABD,  in the last 168 hours  Chemistries   No results found for this basename: NA, K, CL, CO2, GLUCOSE, BUN, CREATININE, GFRCGP, CALCIUM, MG, AST, ALT, ALKPHOS, BILITOT,  in the last 168 hours ------------------------------------------------------------------------------------------------------------------ No results found for this basename: HGBA1C,  in the last 72 hours ------------------------------------------------------------------------------------------------------------------ No results found for this basename: CHOL, HDL, LDLCALC, TRIG, CHOLHDL, LDLDIRECT,  in the last 72 hours ------------------------------------------------------------------------------------------------------------------ No results found for this basename: TSH, T4TOTAL, FREET3, T3FREE, THYROIDAB,  in the last 72 hours ------------------------------------------------------------------------------------------------------------------ No results found for this basename: VITAMINB12, FOLATE, FERRITIN, TIBC, IRON, RETICCTPCT,  in the last 72 hours  Coagulation profile  No results found for this basename: INR, PROTIME,  in the last 168 hours    Assessment &  Plan   1. Mastitis in female  - amoxicillin (AMOXIL) 500  MG capsule; Take 1 capsule (500 mg total) by mouth 3 (three) times daily.  Dispense: 30 capsule; Refill: 0 - naproxen (NAPROSYN) 500 MG tablet; Take 1 tablet (500 mg total) by mouth 2 (two) times daily with a meal.  Dispense: 30 tablet; Refill: 0 Return in 2 weeks for followup or call if symptoms persist or if fever develops  2. Dental caries  - naproxen (NAPROSYN) 500 MG tablet; Take 1 tablet (500 mg total) by mouth 2 (two) times daily with a meal.  Dispense: 30 tablet; Refill: 0 - Ambulatory referral to Dentistry   Follow up in 3 months or when necessary   The patient was given clear instructions to go to ER or return to medical center if symptoms don't improve, worsen or new problems develop. The patient verbalized understanding. The patient was told to call to get lab results if they haven't heard anything in the next week.    Angelica Chessman, MD, [place], [place], [place] and [place], [place]   05/05/2013, 3:23 PM

## 2013-05-05 NOTE — Progress Notes (Signed)
Pt is here today because since yesterday she has and inflamed lump that is very painful on her right breast.

## 2013-05-05 NOTE — Patient Instructions (Signed)
Mastitis  (Mastitis ) La mastitis es una inflamacin en el tejido Gabriela Pierce. Generalmente ocurre en las mujeres que Gabriela Pierce, pero tambin puede afectar a otras mujeres y, en algunos Gabriela Pierce, a los hombres. CAUSAS  Generalmente la causa de la mastitis es una infeccin bacteriana. Las bacterias ingresan al tejido mamario travs de cortes o grietas en la piel. Generalmente esto ocurre al Gabriela Pierce, debido a las grietas o irritacin de la piel. En algunos casos puede ocurrir an cuando no Gabriela Pierce. Tambin se relaciona con la obstruccin de los conductos por los que Gabriela of Staff (lactferos). Un piercing en los pezones puede ocasionar una mastitis. Adems, algunas formas de cncer de Pierce pueden causar mastitis. Gabriela Pierce, enrojecimiento, sensibilidad y dolor en la zona de la Pierce.  Hinchazn de los ganglios que se encuentran debajo del brazo, en el mismo lado.  Gabriela Pierce. Pierce se permite que la infeccin progrese, podr formarse una acumulacin de pus (absceso). DIAGNSTICO  El mdico podr hacer el diagnstico de mastitis en base a sus sntomas y el examen fsico. Le indicarn estudios para confirmar el diagnstico. Estos pueden ser:   Extraccin del pus de la Pierce, aplicando presin en la zona. El pus se examinar en el laboratorio para determinar de qu bacteria se trata. Pierce hay un absceso, podrn retirarle el lquido con Gabriela Pierce. Con el lquido se confirmar el diagnstico y se Gabriela Pierce bacteria que causa el Pierce. En la Gabriela Pierce no se observa pus.  Le solicitarn anlisis de sangre para determinar Pierce su organismo est luchando contra una infeccin bacteriana.  Una mamografa o una ecografa podrn descartar otros problemas o enfermedades. TRATAMIENTO  Antibiticos para combatir la infeccin bacteriana. El Personal assistant qu bacteria es la que est causando la infeccin y Arboriculturist el tipo de antibitico ms adecuado. Podr  cambiarlo segn el resultado del cultivo o Pierce la respuesta al antibitico no es la Gabriela Pierce. Los antibiticos se administran por va oral. Tambin le recetar medicamentos para Gabriela Pierce. La mastitis que se produce debido al amamantamiento podr mejorar sin tratamiento, por lo tanto el mdico podr indicarle que espere 24 horas despus de verla por primera vez para decidir Pierce necesita recetarle un medicamento. INSTRUCCIONES PARA EL CUIDADO EN EL HOGAR   Slo tome medicamentos de venta libre o recetados para Gabriela Pierce, Health and safety inspector o bajar la fiebre, segn las indicaciones de su mdico.  Pierce su mdico le receta antibiticos, tmelos tal [employer] indic. Gabriela de que finaliza la prescripcin completa aunque se sienta mejor.  No use un sostn demasiado ajustado o con aro. Use un sostn blando, de soporte.  Aumente la ingestin de lquidos, especialmente Pierce tiene fiebre.  Las mujeres que Gabriela Pierce deben seguir las siguientes indicaciones:  Gabriela Pierce. El Art gallery Pierce informar Pierce su leche es segura para el beb o debe descartarla. Podrn indicarle que deje de Gabriela Pierce mdico considere que es seguro para su beb. Use un sacaleche Pierce le aconsejan dejar de amamantar.  Mantenga los pezones secos y limpios.  Vace la primera Pierce completamente antes de amamantar con la segunda. Pierce el beb no vaca la Pierce por algn motivo, utilice un sacaleche.  Pierce debe regresar a Gabriela Pierce, use un sacaleche en el horario de trabajo para Gabriela Pierce horarios.  Evite que las mamas se llenen mucho de Gabriela Pierce (congestin) Gabriela Pierce:   Gabriela Pierce secrecin similar a pus  por la Pierce.  Los sntomas no mejoran con el tratamiento indicado por su mdico dentro de los 2 das. Gabriela Pierce Pierce:   El dolor y la hinchazn empeoran.  Aumenta el dolor y no puede controlarlo con Gabriela officer, nature.  Observa una lnea roja que se extiende desde la  Pierce hasta la axila.  Tiene fiebre o sntomas persistentes durante ms de 2 - 3 das.  Tiene fiebre y los sntomas empeoran repentinamente. Document Released: 01/08/2005 Document Revised: 01/19/2013 [facility] Patient Information 2014 [place], [name].

## 2013-06-27 ENCOUNTER — Ambulatory Visit (INDEPENDENT_AMBULATORY_CARE_PROVIDER_SITE_OTHER): Payer: Medicaid Other | Admitting: Medical

## 2013-06-27 DIAGNOSIS — N61 Mastitis without abscess: Secondary | ICD-10-CM

## 2013-06-29 NOTE — Progress Notes (Signed)
Encounter was opened in error. Patient's appointment was cancelled.

## 2013-08-04 ENCOUNTER — Ambulatory Visit: Payer: No Typology Code available for payment source | Attending: Internal Medicine | Admitting: Internal Medicine

## 2013-08-04 ENCOUNTER — Encounter: Payer: Self-pay | Admitting: Internal Medicine

## 2013-08-04 VITALS — BP 126/85 | HR 79 | Temp 98.0°F | Resp 16 | Ht 60.0 in | Wt 134.8 lb

## 2013-08-04 DIAGNOSIS — K089 Disorder of teeth and supporting structures, unspecified: Secondary | ICD-10-CM | POA: Insufficient documentation

## 2013-08-04 DIAGNOSIS — Z09 Encounter for follow-up examination after completed treatment for conditions other than malignant neoplasm: Secondary | ICD-10-CM | POA: Insufficient documentation

## 2013-08-04 DIAGNOSIS — K029 Dental caries, unspecified: Secondary | ICD-10-CM | POA: Insufficient documentation

## 2013-08-04 DIAGNOSIS — N61 Mastitis without abscess: Secondary | ICD-10-CM | POA: Insufficient documentation

## 2013-08-04 MED ORDER — NAPROXEN 500 MG PO TABS: 500.0000 mg | ORAL_TABLET | Freq: Two times a day (BID) | ORAL | Status: DC

## 2013-08-04 NOTE — Patient Instructions (Signed)
Caries dental  (Dental Caries) La caries dental es la ms comn de todas las enfermedades de la boca. Ocurre en todas las edades, pero es ms frecuente en nios y [place].  CMO SE DESARROLLA LA CARIES DENTAL  El proceso de caries comienza cuando las bacterias de la boca se combinan con los alimentos, (especialmente azcares y almidones) para Visual merchandiser. La placa es una sustancia que se adhiere a las superficies duras de los dientes (Paramedic). Las bacterias de la placa producen cidos que atacan el esmalte de los dientes. Estos cidos tambin pueden atacar la superficie de la raz de un diente (cemento) si este est expuesto. Los ataques repetidos disuelven estas superficies y crean huecos en el diente (cavidades). Si no se tratan, los cidos [employer] capas del diente.  [place] frecuente de bebidas azucaradas.   El consumo frecuente de alimentos que contienen azcar y almidn y de aquellos que se quedan fcilmente adheridos a los dientes.   Higiene bucal deficiente.   Sequedad en la boca.   Abuso de sustancias como metanfetaminas.   Arreglos dentales en mal estado o mal hechos.   Trastornos de Youth worker.   Reflujo gastroesofgico (ERGE).   Ciertos tratamientos de radiacin en la cabeza y el cuello. SNTOMAS  En las etapas tempranas de la caries dental, rara vez hay sntomas. En algunos casos pueden observarse zonas blancas, con aspecto de tiza, [employer] u otras capas del diente. En las etapas posteriores, los sntomas incluyen:   Hoyos y [employer].  Dolor en los dientes despus de consumir alimentos o bebidas dulces, calientes o fros.  Dolor alrededor del diente.  Inflamacin alrededor del diente. [place] veces, la caries dental se detecta durante un control habitual. El diagnstico se realiza despus hacer de una detallada historia mdica y odontolgica y de la  observacin de las superficies de los dientes buscando signos de caries dental. En algunos casos se utilizan instrumentos especiales, como rayos lser, para buscar caries dentales. Podrn tomarle radiografas dentales de modo que puedan buscarse caries que no se observan a simple vista (como entre las zonas de [employer]).  TRATAMIENTO  Si la caries dental se encuentra en una etapa temprana, podr revertirse con un tratamiento con flor o la aplicacin de un agente remineralizante en el consultorio del dentista. Es necesario un buen cepillado y [place] del hilo dental para ayudar a estos tratamientos. Si est en etapas ms avanzadas, el tratamiento depender de la ubicacin y la extensin de la destruccin dental:   Si se ha destruido una pequea zona del diente, la zona ser removida y las cavidades se llenarn con un material como una amalgama de oro o plata o un compuesto de resina.   Si se ha destruido una zona grande del diente, la zona destruida ser removida y se Glass blower/designer una cubierta (corona) sobre la estructura que quede del diente.   Si est afectada la parte central del diente (pulpa), ser necesario realizar un procedimiento llamado tratamiento de conducto antes de llenar la cavidad o colocar una corona.   Si la mayor parte del diente est destruido, ser necesario extirpar Acupuncturist (extraerlo). INSTRUCCIONES PARA EL CUIDADO EN EL HOGAR  Podr evitar, detener o revertir las caries dentales en su casa, con una buena higiene bucal. La buena higiene bucal incluye:   Una buena higiene de los dientes al [employer] veces por  da con cepillo e hilo dental.   Use una pasta dental con flor. [place] usar un enjuague dental con flor si se lo recomienda el odontlogo o el mdico.   Limite la cantidad de alimentos y bebidas que contengan azcar y almidones que consume.   Evite el consumo frecuente de estos alimentos y bebidas.   [place] con las visitas a un dentista  para realizar controles y limpieza regulares. PREVENCIN   Mantenga una buena higiene bucal.  Considere un sellador dental. Un sellador dental es un revestimiento de material que aplica el dentista a las muescas y [name]. El sellador impide que los alimentos queden atrapados en los huecos. Puede proteger a los Marine scientist.  Pida suplementos con flor si vive en una comunidad cuya agua no tiene flor o con agua que tenga bajo contenido de flor. Use suplementos de flor segn las indicaciones del odontlogo o el mdico.  Permita las aplicaciones de flor en los dientes si se lo indica el odontlogo o el mdico. Document Released: 03/31/2005 Document Revised: 12/01/2012 [facility] Patient Information 2014 [place], [name].

## 2013-08-04 NOTE — Progress Notes (Signed)
Patient ID: Gabriela Pierce, female   DOB: 07-16-1975, 38 y.o.   MRN: [id number]   Gabriela Pierce, is a 38 y.o. female  NAT:[id number]  KYH:[id number]  DOB - 01-12-1976  Chief Complaint  Patient presents with  . Follow-up        Subjective:   Gabriela Pierce is a 38 y.o. female here today for a follow up visit. Patient was recently seen and treated for mastitis in her breast-feeding, lump and redness have resolved but she has occasional pain especially when she is breast-feeding. No new swelling or discharge. She also wants to be referred to a dentist for dental caries. She has no other complaints. No fever. Patient has No headache, No chest pain, No abdominal pain - No Nausea, No new weakness tingling or numbness, No Cough - SOB.  No problems updated.  ALLERGIES: No Known Allergies  PAST MEDICAL HISTORY: Past Medical History  Diagnosis Date  . Gestational diabetes     MEDICATIONS AT HOME: Prior to Admission medications   Medication Sig Start Date End Date Taking? Authorizing Provider  naproxen (NAPROSYN) 500 MG tablet Take 1 tablet (500 mg total) by mouth 2 (two) times daily with a meal. 08/04/13  Yes Gabriela Chessman, MD  Prenatal Vit-Fe Fumarate-FA (PRENATAL MULTIVITAMIN) TABS Take 1 tablet by mouth daily.   Yes Historical Provider, MD  amoxicillin (AMOXIL) 500 MG capsule Take 1 capsule (500 mg total) by mouth 3 (three) times daily. 05/05/13   Gabriela Chessman, MD  ibuprofen (ADVIL,MOTRIN) 600 MG tablet Take 1 tablet (600 mg total) by mouth every 6 (six) hours. 06/20/11 06/30/11  Shelly Bombard, MD  medroxyPROGESTERone (DEPO-PROVERA) 150 MG/ML injection Inject 1 mL (150 mg total) into the muscle every 3 (three) months. 06/20/11   Shelly Bombard, MD     Objective:   Filed Vitals:   08/04/13 1454  BP: 126/85  Pulse: 79  Temp: 98 F (36.7 C)  TempSrc: Oral  Resp: 16  Height: 5' (1.524 m)  Weight: 134 lb 12.8 oz (61.145 kg)  SpO2: 98%     Exam General appearance : Awake, alert, not in any distress. Speech Clear. Not toxic looking, dental caries HEENT: Atraumatic and Normocephalic, pupils equally reactive to light and accomodation Neck: supple, no JVD. No cervical lymphadenopathy.  Chest:Good air entry bilaterally, no added sounds  CVS: S1 S2 regular, no murmurs.  Abdomen: Bowel sounds present, Non tender and not distended with no gaurding, rigidity or rebound. Extremities: B/L Lower Ext shows no edema, both legs are warm to touch Neurology: Awake alert, and oriented X 3, CN II-XII intact, Non focal Skin:No Rash Wounds:N/A  Data Review No results found for this basename: HGBA1C     Assessment & Plan   1. Dental caries  - Ambulatory referral to Dentistry - naproxen (NAPROSYN) 500 MG tablet; Take 1 tablet (500 mg total) by mouth 2 (two) times daily with a meal.  Dispense: 30 tablet; Refill: 0  2. Mastitis in female Resolved  Patient was counseled about nutrition and exercise Interpreter was used to communicate directly with patient for the entire encounter including providing detailed patient instructions.   Return in about 1 year (around 08/05/2014) for Routine Follow Up, Annual Physical.  The patient was given clear instructions to go to ER or return to medical center if symptoms don't improve, worsen or new problems develop. The patient verbalized understanding. The patient was told to call to get lab results if they haven't heard anything in the next week.  This note has been created with Surveyor, quantity. Any transcriptional errors are unintentional.    Gabriela Chessman, MD, [place], [place], [place] and [facility] [place] [address], [place]   08/04/2013, 3:43 PM

## 2013-08-04 NOTE — Progress Notes (Signed)
Patient here to to follow up on mastitis and tooth pain. Patient reports the lump in breast is gone but is still having some pain.

## 2013-09-29 ENCOUNTER — Other Ambulatory Visit: Payer: Self-pay | Admitting: Nurse Practitioner

## 2013-09-29 DIAGNOSIS — N644 Mastodynia: Secondary | ICD-10-CM

## 2013-10-03 ENCOUNTER — Ambulatory Visit
Admission: RE | Admit: 2013-10-03 | Discharge: 2013-10-03 | Disposition: A | Discharge status: Home / Self Care | Payer: Medicaid Other | Source: Ambulatory Visit | Attending: Nurse Practitioner | Admitting: Nurse Practitioner

## 2013-10-03 DIAGNOSIS — N644 Mastodynia: Secondary | ICD-10-CM

## 2013-12-26 LAB — OB RESULTS CONSOLE RUBELLA ANTIBODY, IGM: RUBELLA: IMMUNE

## 2013-12-26 LAB — OB RESULTS CONSOLE RPR: RPR: NONREACTIVE

## 2013-12-26 LAB — OB RESULTS CONSOLE GC/CHLAMYDIA
CHLAMYDIA, DNA PROBE: NEGATIVE
GC PROBE AMP, GENITAL: NEGATIVE

## 2013-12-26 LAB — OB RESULTS CONSOLE HIV ANTIBODY (ROUTINE TESTING): HIV: NONREACTIVE

## 2013-12-27 ENCOUNTER — Ambulatory Visit (HOSPITAL_COMMUNITY): Payer: Medicaid Other

## 2013-12-27 ENCOUNTER — Other Ambulatory Visit (HOSPITAL_COMMUNITY): Payer: Self-pay | Admitting: Nurse Practitioner

## 2013-12-27 DIAGNOSIS — Z3682 Encounter for antenatal screening for nuchal translucency: Secondary | ICD-10-CM

## 2013-12-28 ENCOUNTER — Encounter (HOSPITAL_COMMUNITY): Payer: Self-pay

## 2013-12-28 ENCOUNTER — Ambulatory Visit (HOSPITAL_COMMUNITY)
Admission: RE | Admit: 2013-12-28 | Discharge: 2013-12-28 | Disposition: A | Discharge status: Home / Self Care | Payer: Medicaid Other | Source: Ambulatory Visit | Attending: Nurse Practitioner | Admitting: Nurse Practitioner

## 2013-12-28 ENCOUNTER — Ambulatory Visit (HOSPITAL_COMMUNITY): Admission: RE | Admit: 2013-12-28 | Payer: Medicaid Other | Source: Ambulatory Visit

## 2013-12-28 ENCOUNTER — Other Ambulatory Visit (HOSPITAL_COMMUNITY): Payer: Self-pay | Admitting: Nurse Practitioner

## 2013-12-28 DIAGNOSIS — Z3682 Encounter for antenatal screening for nuchal translucency: Secondary | ICD-10-CM

## 2013-12-28 DIAGNOSIS — O352XX0 Maternal care for (suspected) hereditary disease in fetus, not applicable or unspecified: Secondary | ICD-10-CM | POA: Diagnosis not present

## 2013-12-28 DIAGNOSIS — O09529 Supervision of elderly multigravida, unspecified trimester: Secondary | ICD-10-CM | POA: Diagnosis present

## 2013-12-28 DIAGNOSIS — Z36 Encounter for antenatal screening of mother: Secondary | ICD-10-CM | POA: Diagnosis not present

## 2013-12-28 DIAGNOSIS — O09521 Supervision of elderly multigravida, first trimester: Secondary | ICD-10-CM

## 2013-12-28 DIAGNOSIS — O352XX1 Maternal care for (suspected) hereditary disease in fetus, fetus 1: Secondary | ICD-10-CM

## 2014-01-09 ENCOUNTER — Other Ambulatory Visit: Payer: Self-pay

## 2014-02-02 ENCOUNTER — Other Ambulatory Visit (HOSPITAL_COMMUNITY): Payer: Self-pay | Admitting: Maternal and Fetal Medicine

## 2014-02-02 DIAGNOSIS — Z8632 Personal history of gestational diabetes: Secondary | ICD-10-CM

## 2014-02-02 DIAGNOSIS — O09292 Supervision of pregnancy with other poor reproductive or obstetric history, second trimester: Secondary | ICD-10-CM

## 2014-02-02 DIAGNOSIS — O09522 Supervision of elderly multigravida, second trimester: Secondary | ICD-10-CM

## 2014-02-02 DIAGNOSIS — Z8489 Family history of other specified conditions: Secondary | ICD-10-CM

## 2014-02-08 ENCOUNTER — Ambulatory Visit (HOSPITAL_COMMUNITY)
Admission: RE | Admit: 2014-02-08 | Discharge: 2014-02-08 | Disposition: A | Discharge status: Home / Self Care | Payer: Medicaid Other | Source: Ambulatory Visit | Attending: Nurse Practitioner | Admitting: Nurse Practitioner

## 2014-02-08 ENCOUNTER — Ambulatory Visit (HOSPITAL_COMMUNITY)
Admission: RE | Admit: 2014-02-08 | Discharge: 2014-02-08 | Disposition: A | Discharge status: Home / Self Care | Payer: Medicaid Other | Source: Ambulatory Visit | Attending: Internal Medicine | Admitting: Internal Medicine

## 2014-02-08 ENCOUNTER — Other Ambulatory Visit (HOSPITAL_COMMUNITY): Payer: Self-pay | Admitting: Maternal and Fetal Medicine

## 2014-02-08 VITALS — BP 111/68 | HR 79

## 2014-02-08 DIAGNOSIS — Z3689 Encounter for other specified antenatal screening: Secondary | ICD-10-CM

## 2014-02-08 DIAGNOSIS — O35BXX0 Maternal care for other (suspected) fetal abnormality and damage, fetal cardiac anomalies, not applicable or unspecified: Secondary | ICD-10-CM

## 2014-02-08 DIAGNOSIS — Z8489 Family history of other specified conditions: Secondary | ICD-10-CM | POA: Diagnosis not present

## 2014-02-08 DIAGNOSIS — Z3A18 18 weeks gestation of pregnancy: Secondary | ICD-10-CM | POA: Insufficient documentation

## 2014-02-08 DIAGNOSIS — O09292 Supervision of pregnancy with other poor reproductive or obstetric history, second trimester: Secondary | ICD-10-CM

## 2014-02-08 DIAGNOSIS — Z8632 Personal history of gestational diabetes: Secondary | ICD-10-CM

## 2014-02-08 DIAGNOSIS — O358XX0 Maternal care for other (suspected) fetal abnormality and damage, not applicable or unspecified: Secondary | ICD-10-CM | POA: Insufficient documentation

## 2014-02-08 DIAGNOSIS — O09522 Supervision of elderly multigravida, second trimester: Secondary | ICD-10-CM | POA: Diagnosis not present

## 2014-02-08 DIAGNOSIS — O09529 Supervision of elderly multigravida, unspecified trimester: Secondary | ICD-10-CM | POA: Insufficient documentation

## 2014-02-08 NOTE — Progress Notes (Signed)
Genetic Counseling  High-Risk Gestation Note  Appointment Date:  02/08/2014 Referred By: Gabriela Garter, MD Date of Birth:  1976/02/09    Pregnancy History: [id number] Estimated Date of Delivery: 07/06/14 Estimated Gestational Age: [redacted]w[redacted]d Attending: [place], MD  Ms. Gabriela Pierce was seen for genetic counseling regarding a maternal age of 38 y.o. and an increased risk for fetal trisomy 69 based on maternal serum Quad screening through [place].  Gabriela Pierce, Gabriela Pierce, translated during the genetic counseling appointment today.  She was counseled regarding maternal age and the association with risk for chromosome conditions due to nondisjunction with aging of the ova.   We reviewed chromosomes, nondisjunction, and the associated 1 in 80 risk for fetal aneuploidy related to a maternal age of 38 y.o. at [redacted]w[redacted]d gestation.  She was counseled that the risk for aneuploidy decreases as gestational age increases, accounting for those pregnancies which spontaneously abort.  We specifically discussed Down syndrome (trisomy 74), trisomies 50 and 61, and sex chromosome aneuploidies (47,XXX and 47,XXY) including the common features and prognoses of each.   We also reviewed [name]'s maternal serum Quad screen result and the associated increase in risk for fetal trisomy 18 (1 in 693 to 1 in 67).  However, the [facility] used to calculate [name]'s Quad screen was based on her LMP (06/30/14).  She had a first trimester ultrasound which was consistent with an EDC of 07/05/13.  This considered, Ms. Gabriela Pierce screen was drawn too early for interpretation.      We reviewed available screening options including redrawing the Quad screen, noninvasive prenatal screening (NIPS) and detailed ultrasound.  She was counseled that screening tests are used to modify a patient's a priori risk for aneuploidy, typically based on age. This estimate provides a pregnancy specific  risk assessment. We reviewed the benefits and limitations of each option. Specifically, we discussed the conditions for which each test screens, the detection rates, and false positive rates of each. She was also counseled regarding diagnostic testing via amniocentesis. We reviewed the approximate 1 in 193-790 risk for complications for amniocentesis, including spontaneous pregnancy loss. After consideration of all the options, she elected to proceed with NIPS.  Those results will be available in 8-10 days.     The patient also expressed interest in having a detailed ultrasound.  A complete ultrasound was performed today. The ultrasound report will be documented separately. There were some views of the fetal heart that were suggestive of a ventricular septal defect, and others that were not. We discussed the option of a fetal echocardiogram to further assess the fetal heart.  [name] elected to have a fetal echocardiogram. We will schedule this appointment for her.  The remainder of the fetal anatomy was normal.  There were no markers suggestive of fetal aneuploidy.  Diagnostic testing was declined today.  She understands that screening tests cannot rule out all birth defects or genetic syndromes. The patient was advised of this limitation and states she still does not want additional testing at this time.   [name] was provided with written information regarding sickle cell anemia (SCA) including the carrier frequency and incidence in the Hispanic population, the availability of carrier testing and prenatal diagnosis if indicated.  In addition, we discussed that hemoglobinopathies are routinely screened for as part of the [place] newborn screening panel.  She declined hemoglobin electrophoresis today.   Both family histories were reviewed and found to be contributory.  [name] reported that her sister's  daughter has trisomy 21.  She was diagnosed by postnatal chromosome analysis.   Medical records were not available to verify the reported history.  We discussed that chromosome conditions, including trisomy 13, typically occur due to nondisjunction (>90% of cases); however, a smaller percentage of cases of trisomy 47 are the result of a chromosome translocation involving chromosome 25. We discussed the option of chromosome analysis to determine if an individual is a carrier of a balanced translocation involving chromosome #13.  If an individual carries a balanced translocation involving chromosome #13, then the chance to have a baby with trisomy 62 would be greater than the maternal age-related risk.  In addition, [name] reported that she has two maternal uncles who have congenital hearing loss of unknown etiology. We discussed that hearing loss can have many causes including genetic factors or environmental factors or both.  Hearing loss can occur as one feature of an underlying genetic condition (syndromic) or may occur as an isolated condition.  In either situation it may be due to an abnormal gene or pair of genes or occur sporadically.  When it is inherited it can follow an X linked recessive, autosomal recessive or autosomal dominant pattern of inheritance. Without further information regarding the provided family history, an accurate genetic risk cannot be calculated. Further genetic counseling is warranted if more information is obtained.  [name] denied exposure to environmental toxins or chemical agents. She denied the use of alcohol, tobacco or street drugs. She denied significant viral illnesses during the course of her pregnancy. Her medical and surgical histories were noncontributory.     I counseled this patient regarding the above risks and available options.  The approximate face-to-face time with the genetic counselor was 44 minutes.    Sharyne Richters, MS  Certified Genetic Counselor

## 2014-02-13 ENCOUNTER — Encounter (HOSPITAL_COMMUNITY): Payer: Self-pay

## 2014-02-20 ENCOUNTER — Telehealth (HOSPITAL_COMMUNITY): Payer: Self-pay

## 2014-02-20 NOTE — Telephone Encounter (Signed)
Called Gabriela Pierce to discuss her cell free fetal DNA test results.  Mrs. Gabriela Pierce had Panorama testing through [facility].  Testing was offered because of a maternal age of 5.   The patient was identified by name and DOB.  We reviewed that these are within normal limits, showing a less than 1 in 10,000 risk for trisomies 21, 18 and 13, and monosomy X (Turner syndrome).  In addition, the risk for triploidy/vanishing twin and sex chromosome trisomies (47,XXX and 47,XXY) was also low risk.  [name] elected to have cffDNA analysis for 22q11 deletion syndrome, which was also low risk (<1 in 3330).  We reviewed that this testing identifies > 99% of pregnancies with trisomy 29, trisomy 88, sex chromosome trisomies (47,XXX and 47,XXY), and triploidy. The detection rate for trisomy 18 is 96%.  The detection rate for monosomy X is ~92%.  The false positive rate is <0.1% for all conditions. Testing was also consistent with female fetal sex.  The patient did wish to know fetal sex.  She understands that this testing does not identify all genetic conditions.  All questions were answered to her satisfaction, she was encouraged to call with additional questions or concerns.  Sharyne Richters, MS Certified Genetic Counselor

## 2014-02-21 ENCOUNTER — Other Ambulatory Visit (HOSPITAL_COMMUNITY): Payer: Self-pay

## 2014-04-14 NOTE — L&D Delivery Note (Signed)
Patient is 39 y.o. [id number] [redacted]w[redacted]d admitted   Delivery Note At 12:13 AM a healthy female was delivered via Vaginal, Spontaneous Delivery (Presentation: Right Occiput Anterior, Compound).  APGAR: 9, 9; weight pending.   Placenta status: Intact, Spontaneous.  Cord:  Three vessel cord with the following complications: Short.   Anesthesia: Epidural  Episiotomy: None Lacerations: 2nd degree;Perineal Suture Repair: 2.0 vicryl rapide Est. Blood Loss (mL):  200  Mom to postpartum.  Baby to Couplet care / Skin to Skin.  Lorna Few 07/04/2014, 12:40 AM  I attended this delivery with the Resident and participated in the delivery. The patient pushed well to crowning and deilvery of the head was accomplished easily. No difficulty with shoulders but there was a compound hand presentation. Cord was noted to be short and broke with delivery.  Second degree laceration repaired in usual fashion with 2-0 Vicryl Rapide. Seabron Spates, CNM

## 2014-06-12 LAB — OB RESULTS CONSOLE GBS: GBS: NEGATIVE

## 2014-07-03 ENCOUNTER — Inpatient Hospital Stay (HOSPITAL_COMMUNITY): Payer: Medicaid Other | Admitting: Anesthesiology

## 2014-07-03 ENCOUNTER — Encounter (HOSPITAL_COMMUNITY): Payer: Self-pay | Admitting: *Deleted

## 2014-07-03 ENCOUNTER — Inpatient Hospital Stay (HOSPITAL_COMMUNITY)
Admission: AD | Admit: 2014-07-03 | Discharge: 2014-07-06 | DRG: 775 | Disposition: A | Discharge status: Home / Self Care | Payer: Medicaid Other | Source: Ambulatory Visit | Attending: Obstetrics & Gynecology | Admitting: Obstetrics & Gynecology

## 2014-07-03 DIAGNOSIS — Z3A39 39 weeks gestation of pregnancy: Secondary | ICD-10-CM | POA: Diagnosis present

## 2014-07-03 DIAGNOSIS — O09523 Supervision of elderly multigravida, third trimester: Secondary | ICD-10-CM

## 2014-07-03 DIAGNOSIS — Z3483 Encounter for supervision of other normal pregnancy, third trimester: Secondary | ICD-10-CM | POA: Diagnosis present

## 2014-07-03 DIAGNOSIS — O42113 Preterm premature rupture of membranes, onset of labor more than 24 hours following rupture, third trimester: Secondary | ICD-10-CM | POA: Diagnosis present

## 2014-07-03 LAB — CBC
HCT: 38.2 % (ref 36.0–46.0)
Hemoglobin: 13 g/dL (ref 12.0–15.0)
MCH: 28 pg (ref 26.0–34.0)
MCHC: 34 g/dL (ref 30.0–36.0)
MCV: 82.3 fL (ref 78.0–100.0)
Platelets: 195 10*3/uL (ref 150–400)
RBC: 4.64 MIL/uL (ref 3.87–5.11)
RDW: 17 % — ABNORMAL HIGH (ref 11.5–15.5)
WBC: 6.8 10*3/uL (ref 4.0–10.5)

## 2014-07-03 LAB — TYPE AND SCREEN
ABO/RH(D): O POS
Antibody Screen: NEGATIVE

## 2014-07-03 LAB — AMNISURE RUPTURE OF MEMBRANE (ROM) NOT AT ARMC: Amnisure ROM: POSITIVE

## 2014-07-03 LAB — POCT FERN TEST: POCT FERN TEST: POSITIVE

## 2014-07-03 MED ORDER — CITRIC ACID-SODIUM CITRATE 334-500 MG/5ML PO SOLN: 30.0000 mL | ORAL | Status: DC | PRN

## 2014-07-03 MED ORDER — FENTANYL 2.5 MCG/ML BUPIVACAINE 1/10 % EPIDURAL INFUSION (WH - ANES)
14.0000 mL/h | INTRAMUSCULAR | Status: DC | PRN
  Administered 2014-07-03: 14 mL/h via EPIDURAL
  Filled 2014-07-03: qty 125

## 2014-07-03 MED ORDER — EPHEDRINE 5 MG/ML INJ
10.0000 mg | INTRAVENOUS | Status: DC | PRN
  Filled 2014-07-03: qty 2

## 2014-07-03 MED ORDER — FENTANYL 2.5 MCG/ML BUPIVACAINE 1/10 % EPIDURAL INFUSION (WH - ANES)
INTRAMUSCULAR | Status: DC | PRN
  Administered 2014-07-03: 14 mL/h via EPIDURAL

## 2014-07-03 MED ORDER — PHENYLEPHRINE 40 MCG/ML (10ML) SYRINGE FOR IV PUSH (FOR BLOOD PRESSURE SUPPORT)
80.0000 ug | PREFILLED_SYRINGE | INTRAVENOUS | Status: DC | PRN
  Filled 2014-07-03: qty 2

## 2014-07-03 MED ORDER — LIDOCAINE HCL (PF) 1 % IJ SOLN
30.0000 mL | INTRAMUSCULAR | Status: DC | PRN
  Filled 2014-07-03: qty 30

## 2014-07-03 MED ORDER — LACTATED RINGERS IV SOLN: 500.0000 mL | INTRAVENOUS | Status: DC | PRN

## 2014-07-03 MED ORDER — LACTATED RINGERS IV SOLN
INTRAVENOUS | Status: DC
  Administered 2014-07-03: 19:00:00 via INTRAVENOUS

## 2014-07-03 MED ORDER — DIPHENHYDRAMINE HCL 50 MG/ML IJ SOLN: 12.5000 mg | INTRAMUSCULAR | Status: DC | PRN

## 2014-07-03 MED ORDER — OXYCODONE-ACETAMINOPHEN 5-325 MG PO TABS: 2.0000 | ORAL_TABLET | ORAL | Status: DC | PRN

## 2014-07-03 MED ORDER — OXYTOCIN BOLUS FROM INFUSION
500.0000 mL | INTRAVENOUS | Status: DC
  Administered 2014-07-04: 500 mL via INTRAVENOUS

## 2014-07-03 MED ORDER — FENTANYL CITRATE 0.05 MG/ML IJ SOLN: 100.0000 ug | INTRAMUSCULAR | Status: DC | PRN

## 2014-07-03 MED ORDER — LIDOCAINE HCL (PF) 1 % IJ SOLN
INTRAMUSCULAR | Status: DC | PRN
  Administered 2014-07-03 (×2): 8 mL

## 2014-07-03 MED ORDER — LACTATED RINGERS IV SOLN
500.0000 mL | Freq: Once | INTRAVENOUS | Status: AC
  Administered 2014-07-03: 500 mL via INTRAVENOUS

## 2014-07-03 MED ORDER — ACETAMINOPHEN 325 MG PO TABS: 650.0000 mg | ORAL_TABLET | ORAL | Status: DC | PRN

## 2014-07-03 MED ORDER — PHENYLEPHRINE 40 MCG/ML (10ML) SYRINGE FOR IV PUSH (FOR BLOOD PRESSURE SUPPORT)
80.0000 ug | PREFILLED_SYRINGE | INTRAVENOUS | Status: DC | PRN
  Filled 2014-07-03: qty 2
  Filled 2014-07-03: qty 20

## 2014-07-03 MED ORDER — OXYTOCIN 40 UNITS IN LACTATED RINGERS INFUSION - SIMPLE MED
1.0000 m[IU]/min | INTRAVENOUS | Status: DC
  Administered 2014-07-03: 2 m[IU]/min via INTRAVENOUS
  Filled 2014-07-03: qty 1000

## 2014-07-03 MED ORDER — TERBUTALINE SULFATE 1 MG/ML IJ SOLN: 0.2500 mg | Freq: Once | INTRAMUSCULAR | Status: AC | PRN

## 2014-07-03 MED ORDER — OXYCODONE-ACETAMINOPHEN 5-325 MG PO TABS: 1.0000 | ORAL_TABLET | ORAL | Status: DC | PRN

## 2014-07-03 MED ORDER — ONDANSETRON HCL 4 MG/2ML IJ SOLN
4.0000 mg | Freq: Four times a day (QID) | INTRAMUSCULAR | Status: DC | PRN
  Administered 2014-07-03: 4 mg via INTRAVENOUS
  Filled 2014-07-03: qty 2

## 2014-07-03 MED ORDER — OXYTOCIN 40 UNITS IN LACTATED RINGERS INFUSION - SIMPLE MED: 62.5000 mL/h | INTRAVENOUS | Status: DC

## 2014-07-03 NOTE — MAU Note (Signed)
States has been leaking fluid for 2 days, but was waiting for strong contractions to start.

## 2014-07-03 NOTE — H&P (Signed)
Gabriela Pierce is a 39 y.o. female [phone] @ [redacted]w[redacted]d pt of [facility] sent from the office for rule out ROM.  She reports leaking of clear fluid, not requiring a pad with onset 3 days ago with off and on again mild contractions.  Contractions restarted this morning and are irregular and mild.  She reports good fetal movement, denies LOF, vaginal bleeding, vaginal itching/burning, urinary symptoms, h/a, dizziness, n/v, or fever/chills.     Maternal Medical History:  Reason for admission: Rupture of membranes.  Nausea.  Contractions: Frequency: irregular.   Duration is approximately 1 minute.   Perceived severity is moderate.    Fetal activity: Perceived fetal activity is normal.   Last perceived fetal movement was within the past hour.    Prenatal complications: no prenatal complications Prenatal Complications - Diabetes: none.    OB History    Gravida Para Term Preterm AB TAB SAB Ectopic Multiple Living   5 3 3  0 1 0 1 0 0 3     Past Medical History  Diagnosis Date  . Gestational diabetes    History reviewed. No pertinent past surgical history. Family History: family history includes Anesthesia problems in her sister. Social History:  reports that she has never smoked. She has never used smokeless tobacco. She reports that she drinks alcohol. She reports that she does not use illicit drugs.   Prenatal Transfer Tool  Maternal Diabetes: No Genetic Screening: Normal Maternal Ultrasounds/Referrals: Normal Fetal Ultrasounds or other Referrals:  None Maternal Substance Abuse:  No Significant Maternal Medications:  None Significant Maternal Lab Results:  Lab values include: Group B Strep negative Other Comments:  None  Review of Systems  Constitutional: Negative for fever, chills and malaise/fatigue.  Eyes: Negative for blurred vision.  Respiratory: Negative for cough and shortness of breath.   Cardiovascular: Negative for chest pain.  Gastrointestinal: Positive for abdominal  pain. Negative for heartburn, nausea and vomiting.  Genitourinary: Negative for dysuria, urgency and frequency.  Musculoskeletal: Negative.   Neurological: Negative for dizziness and headaches.  Psychiatric/Behavioral: Negative for depression.    Dilation: 4 Effacement (%): 60 Station: Ballotable Exam by:: L. Baltazar Najjar, CNM Blood pressure 132/69, pulse 70, temperature 97.7 F (36.5 C), temperature source Oral, resp. rate 18, last menstrual period 09/23/2013, currently breastfeeding.   SSE with negative pooling, positive fern slide and positive Amnisure  Maternal Exam:  Uterine Assessment: Contraction strength is mild.  Contraction duration is 60 seconds. Contraction frequency is irregular.   Abdomen: Fetal presentation: vertex  Cervix: Cervix evaluated by digital exam.     Fetal Exam Fetal Monitor Review: Mode: ultrasound.   Baseline rate: 135.  Variability: moderate (6-25 bpm).   Pattern: accelerations present and no decelerations.    Fetal State Assessment: Category I - tracings are normal.     Physical Exam  Nursing note and vitals reviewed. Constitutional: She is oriented to person, place, and time. She appears well-developed and well-nourished.  Neck: Normal range of motion.  Cardiovascular: Normal rate, regular rhythm and normal heart sounds.   Respiratory: Effort normal and breath sounds normal.  GI: Soft.  Musculoskeletal: Normal range of motion.  Neurological: She is alert and oriented to person, place, and time.  Skin: Skin is warm and dry.  Psychiatric: She has a normal mood and affect. Her behavior is normal. Judgment and thought content normal.    Prenatal labs: ABO, Rh:  O positive Antibody:  Neg Rubella: Immune (09/14 0000) RPR: Nonreactive (09/14 0000)  HBsAg:   Neg  HIV: Non-reactive (09/14 0000)  GBS: Negative (02/29 0000)  1 hour glucose 152 with normal 3 hour test  Assessment/Plan: 1. Preterm premature rupture of membranes (PPROM) with onset of  labor after 24 hours of rupture in third trimester, antepartum   2. GBS negative  Admit to [facility] with Pitocin 2 milliunits to start, increase by 2 per protocol Anticipate NSVD    [place], Gabriela Pierce 07/03/2014, 6:05 PM

## 2014-07-03 NOTE — Progress Notes (Signed)
Joanne Salah Martinez-Vidal is a 39 y.o. [id number] at [redacted]w[redacted]d   Subjective: Uncomfortable with contractions, but epidural just placed.  Objective: BP 145/60 mmHg  Pulse 81  Temp(Src) 97.3 F (36.3 C) (Oral)  Resp 18  Ht 5\' 2"  (1.575 m)  Wt 72.576 kg (160 lb)  BMI 29.26 kg/m2  SpO2 100%  LMP 09/23/2013     FHT:  FHR: 130 bpm, variability: moderate,  accelerations:  Present,  decelerations:  Absent UC:   regular SVE:   Dilation: 8 Effacement (%): 80 Station: 0 Exam by:: [email or web address], rnc   Labs: Lab Results  Component Value Date   WBC 6.8 07/03/2014   HGB 13.0 07/03/2014   HCT 38.2 07/03/2014   MCV 82.3 07/03/2014   PLT 195 07/03/2014    Assessment / Plan: Induction of labor due to PROM,  progressing well on pitocin  Labor: Progressing normally. Pitocin Preeclampsia:  no signs or symptoms of toxicity Fetal Wellbeing:  Category I Pain Control:  Epidural I/D:  n/a Anticipated MOD:  NSVD  Lorna Few 07/03/2014, 11:56 PM

## 2014-07-03 NOTE — Anesthesia Preprocedure Evaluation (Signed)
Anesthesia Evaluation  Patient identified by MRN, date of birth, ID band Patient awake    Reviewed: Allergy & Precautions, H&P , NPO status , Patient's Chart, lab work & pertinent test results  Airway Mallampati: I  TM Distance: >3 FB Neck ROM: full    Dental no notable dental hx.    Pulmonary neg pulmonary ROS,    Pulmonary exam normal       Cardiovascular negative cardio ROS      Neuro/Psych negative neurological ROS  negative psych ROS   GI/Hepatic negative GI ROS, Neg liver ROS,   Endo/Other  negative endocrine ROSdiabetes  Renal/GU negative Renal ROS     Musculoskeletal   Abdominal Normal abdominal exam  (+)   Peds  Hematology negative hematology ROS (+)   Anesthesia Other Findings   Reproductive/Obstetrics (+) Pregnancy                             Anesthesia Physical Anesthesia Plan  ASA: II  Anesthesia Plan: Epidural   Post-op Pain Management:    Induction:   Airway Management Planned:   Additional Equipment:   Intra-op Plan:   Post-operative Plan:   Informed Consent: I have reviewed the patients History and Physical, chart, labs and discussed the procedure including the risks, benefits and alternatives for the proposed anesthesia with the patient or authorized representative who has indicated his/her understanding and acceptance.     Plan Discussed with:   Anesthesia Plan Comments:         Anesthesia Quick Evaluation

## 2014-07-03 NOTE — Progress Notes (Addendum)
I assisted RN with some questions, by Juliann Mule Interpreter

## 2014-07-03 NOTE — Anesthesia Procedure Notes (Signed)
Epidural Patient location during procedure: OB Start time: 07/03/2014 11:35 PM End time: 07/03/2014 11:39 PM  Staffing Anesthesiologist: Lyn Hollingshead Performed by: anesthesiologist   Preanesthetic Checklist Completed: patient identified, surgical consent, pre-op evaluation, timeout performed, IV checked, risks and benefits discussed and monitors and equipment checked  Epidural Patient position: sitting Prep: site prepped and draped and DuraPrep Patient monitoring: continuous pulse ox and blood pressure Approach: midline Location: L3-L4 Injection technique: LOR air  Needle:  Needle type: Tuohy  Needle gauge: 17 G Needle length: 9 cm and 9 Needle insertion depth: 5 cm cm Catheter type: closed end flexible Catheter size: 19 Gauge Catheter at skin depth: 10 cm Test dose: negative and Other  Assessment Sensory level: T9 Events: blood not aspirated, injection not painful, no injection resistance, negative IV test and no paresthesia  Additional Notes Reason for block:procedure for pain

## 2014-07-04 ENCOUNTER — Encounter (HOSPITAL_COMMUNITY): Payer: Self-pay | Admitting: *Deleted

## 2014-07-04 DIAGNOSIS — Z3A39 39 weeks gestation of pregnancy: Secondary | ICD-10-CM

## 2014-07-04 DIAGNOSIS — O09523 Supervision of elderly multigravida, third trimester: Secondary | ICD-10-CM

## 2014-07-04 LAB — RPR: RPR Ser Ql: NONREACTIVE

## 2014-07-04 LAB — CBC
HEMATOCRIT: 36.3 % (ref 36.0–46.0)
Hemoglobin: 12.1 g/dL (ref 12.0–15.0)
MCH: 27.6 pg (ref 26.0–34.0)
MCHC: 33.3 g/dL (ref 30.0–36.0)
MCV: 82.7 fL (ref 78.0–100.0)
Platelets: 180 10*3/uL (ref 150–400)
RBC: 4.39 MIL/uL (ref 3.87–5.11)
RDW: 16.6 % — ABNORMAL HIGH (ref 11.5–15.5)
WBC: 10.3 10*3/uL (ref 4.0–10.5)

## 2014-07-04 MED ORDER — WITCH HAZEL-GLYCERIN EX PADS: 1.0000 "application " | MEDICATED_PAD | CUTANEOUS | Status: DC | PRN

## 2014-07-04 MED ORDER — DIPHENHYDRAMINE HCL 25 MG PO CAPS: 25.0000 mg | ORAL_CAPSULE | Freq: Four times a day (QID) | ORAL | Status: DC | PRN

## 2014-07-04 MED ORDER — LANOLIN HYDROUS EX OINT: TOPICAL_OINTMENT | CUTANEOUS | Status: DC | PRN

## 2014-07-04 MED ORDER — ONDANSETRON HCL 4 MG/2ML IJ SOLN: 4.0000 mg | INTRAMUSCULAR | Status: DC | PRN

## 2014-07-04 MED ORDER — PRENATAL MULTIVITAMIN CH
1.0000 | ORAL_TABLET | Freq: Every day | ORAL | Status: DC
  Administered 2014-07-04 – 2014-07-06 (×3): 1 via ORAL
  Filled 2014-07-04 (×3): qty 1

## 2014-07-04 MED ORDER — IBUPROFEN 600 MG PO TABS
600.0000 mg | ORAL_TABLET | Freq: Four times a day (QID) | ORAL | Status: DC
  Administered 2014-07-04 – 2014-07-06 (×10): 600 mg via ORAL
  Filled 2014-07-04 (×10): qty 1

## 2014-07-04 MED ORDER — SENNOSIDES-DOCUSATE SODIUM 8.6-50 MG PO TABS
2.0000 | ORAL_TABLET | ORAL | Status: DC
  Administered 2014-07-04 – 2014-07-05 (×2): 2 via ORAL
  Filled 2014-07-04 (×2): qty 2

## 2014-07-04 MED ORDER — OXYCODONE-ACETAMINOPHEN 5-325 MG PO TABS
1.0000 | ORAL_TABLET | ORAL | Status: DC | PRN
  Administered 2014-07-04 – 2014-07-05 (×7): 1 via ORAL
  Filled 2014-07-04 (×7): qty 1

## 2014-07-04 MED ORDER — BENZOCAINE-MENTHOL 20-0.5 % EX AERO
1.0000 "application " | INHALATION_SPRAY | CUTANEOUS | Status: DC | PRN
  Administered 2014-07-05: 1 via TOPICAL
  Filled 2014-07-04: qty 56

## 2014-07-04 MED ORDER — ACETAMINOPHEN 325 MG PO TABS: 650.0000 mg | ORAL_TABLET | ORAL | Status: DC | PRN

## 2014-07-04 MED ORDER — ZOLPIDEM TARTRATE 5 MG PO TABS: 5.0000 mg | ORAL_TABLET | Freq: Every evening | ORAL | Status: DC | PRN

## 2014-07-04 MED ORDER — DIBUCAINE 1 % RE OINT: 1.0000 "application " | TOPICAL_OINTMENT | RECTAL | Status: DC | PRN

## 2014-07-04 MED ORDER — TETANUS-DIPHTH-ACELL PERTUSSIS 5-2.5-18.5 LF-MCG/0.5 IM SUSP: 0.5000 mL | Freq: Once | INTRAMUSCULAR | Status: DC

## 2014-07-04 MED ORDER — SIMETHICONE 80 MG PO CHEW: 80.0000 mg | CHEWABLE_TABLET | ORAL | Status: DC | PRN

## 2014-07-04 MED ORDER — ONDANSETRON HCL 4 MG PO TABS: 4.0000 mg | ORAL_TABLET | ORAL | Status: DC | PRN

## 2014-07-04 NOTE — Progress Notes (Signed)
I assisted Matt Holmes from Lactation with some questions and tips for breast feeding, by Juliann Mule Spanish Interpreter

## 2014-07-04 NOTE — Progress Notes (Signed)
Patient ID: Gabriela Pierce, female   DOB: 06/23/1975, 39 y.o.   MRN: [id number] Post Partum Day 1 Subjective:  Gabriela Pierce is a 39 y.o. [id number] [redacted]w[redacted]d s/p SVD 3/22@0013 .  No acute events overnight.  Pt denies problems with ambulating, voiding or po intake.  She denies nausea or vomiting.  Pain is well controlled.  She has had flatus. She has not had bowel movement.  Lochia minimal and decreasing.  Plan for birth control is Nexplanon.  Method of Feeding: breast  Objective: Blood pressure 110/62, pulse 57, temperature 98.2 F (36.8 C), temperature source Oral, resp. rate 18, height 5\' 2"  (1.575 m), weight 72.576 kg (160 lb), last menstrual period 09/23/2013, SpO2 99 %, currently breastfeeding.  Physical Exam:  General: alert, cooperative and no distress Lochia: normal flow Chest: no increased work of breathing Abdomen: +BS, soft, nontender Uterine Fundus: firm, tender DVT Evaluation: No evidence of DVT seen on physical exam. Extremities: no edema   Recent Labs  07/03/14 1905 07/04/14 0500  HGB 13.0 12.1  HCT 38.2 36.3    Assessment/Plan:  ASSESSMENT: Gabriela Pierce is a 39 y.o. [id number] [redacted]w[redacted]d s/p SVD who is doing well.   Plan for discharge tomorrow, Discharge home, Breastfeeding and Contraception Nexplanon   LOS: 1 day   Trinna Post, Medical Student 07/04/2014, 7:45 AM     Attestation of Attending Supervision of Medical Student: Evaluation and management procedures were performed by the Medical Student under my supervision. Patient is Spanish-speaking only, Spanish interpreter present for this encounter.  Blood pressure 110/62, pulse 57, temperature 98.2 F (36.8 C), temperature source Oral, resp. rate 18, height 5\' 2"  (1.575 m), weight 160 lb (72.576 kg), last menstrual period 09/23/2013, SpO2 99 %, unknown if currently breastfeeding. Benign abdomen, mild lochia, fundus firm  I have seen and examined the patient, reviewed the the student's  note and chart, and I agree with management and plan.  Will discharge to home tomorrow. [facility]planon to be placed at [facility].    Verita Schneiders, MD, [place] Attending [place], [facility]

## 2014-07-04 NOTE — Progress Notes (Signed)
I ordered patient's lunch.  [place]  Interpreter.

## 2014-07-04 NOTE — Progress Notes (Signed)
I ordered patient's dinner, snack and breakfast, by Juliann Mule Interpreter

## 2014-07-04 NOTE — Progress Notes (Signed)
UR chart review completed.  

## 2014-07-04 NOTE — Progress Notes (Signed)
I assisted Dr. Harolyn Rutherford with explanation of care plan.  [place]  Interpreter.

## 2014-07-04 NOTE — Anesthesia Postprocedure Evaluation (Signed)
Anesthesia Post Note  Patient: Gabriela Pierce  Procedure(s) Performed: * No procedures listed *  Anesthesia type: Epidural  Patient location: Mother/Baby  Post pain: Pain level controlled  Post assessment: Post-op Vital signs reviewed  Last Vitals:  Filed Vitals:   07/04/14 0635  BP: 110/62  Pulse: 57  Temp: 36.8 C  Resp: 18    Post vital signs: Reviewed  Level of consciousness:alert  Complications: No apparent anesthesia complications

## 2014-07-04 NOTE — Lactation Note (Signed)
This note was copied from the chart of Gabriela Erline Levine. Lactation Consultation Note  Patient Name: Gabriela Pierce [id number] Date: 07/04/2014 Reason for consult: Other (Comment) (Spanish interpreter Colin Benton )  Baby is 75 hours old and per mom just finished feeding 18 mins , and per mom swallows noted. Per mom breast are feeling warmer and milk feels like it is coming in. [facility] reviewed basics , especially engorgement prevention and tx . Per mom  Has a manual  Pump at home. Mother informed of post-discharge support and given phone number to the lactation department, including  services for phone call assistance; out-patient appointments; and breastfeeding support group. List of other  breastfeeding resources in the community given in the handout. Encouraged mother to call for problems or  concerns related to breastfeeding.   Maternal Data    Feeding Feeding Type:  (per mom baby just feed 18 mins ) Length of feed: 18 min  LATCH Score/Interventions Latch: Grasps breast easily, tongue down, lips flanged, rhythmical sucking.  Audible Swallowing: Spontaneous and intermittent  Type of Nipple: Everted at rest and after stimulation  Comfort (Breast/Nipple): Soft / non-tender     Hold (Positioning): No assistance needed to correctly position infant at breast.  LATCH Score: 10  Lactation Tools Discussed/Used WIC Program: Yes (per mom active with her 37 year old , and plans to call )   Consult Status Consult Status: Follow-up Date: 07/05/14 Follow-up type: In-patient    Myer Haff 07/04/2014, 5:55 PM

## 2014-07-05 MED ORDER — SENNOSIDES-DOCUSATE SODIUM 8.6-50 MG PO TABS: 1.0000 | ORAL_TABLET | Freq: Every day | ORAL | Status: AC

## 2014-07-05 MED ORDER — IBUPROFEN 600 MG PO TABS: 600.0000 mg | ORAL_TABLET | Freq: Four times a day (QID) | ORAL | Status: DC

## 2014-07-05 NOTE — Discharge Summary (Signed)
Obstetric Discharge Summary Reason for Admission: rupture of membranes Prenatal Procedures: none Intrapartum Procedures: spontaneous vaginal delivery Postpartum Procedures: none Complications-Operative and Postpartum: 2nd degree perineal laceration  Delivery Note At 12:13 AM a healthy female was delivered via Vaginal, Spontaneous Delivery (Presentation: Right Occiput Anterior, Compound). APGAR: 9, 9; weight pending.  Placenta status: Intact, Spontaneous. Cord: Three vessel cord with the following complications: Short.   Anesthesia: Epidural  Episiotomy: None Lacerations: 2nd degree;Perineal Suture Repair: 2.0 vicryl rapide Est. Blood Loss (mL): 200  Mom to postpartum. Baby to Couplet care / Skin to Skin.  Hospital Course: Gabriela Pierce is a 39 y.o. [id number] s/p SVD.  Patient was admitted 07/03/14 for PROM.  She has postpartum course that was uncomplicated including no problems with ambulating, PO intake, urination, pain, or bleeding. The pt feels ready to go home and  will be discharged with outpatient follow-up at HD.   Today: No acute events overnight.  Pt denies problems with ambulating, voiding or po intake.  She denies nausea or vomiting.  Pain is well controlled.  She has had flatus. She has not had bowel movement.  Lochia Small.  Plan for birth control is  Nexplanon.  Method of Feeding: Breast  Physical Exam:  General: alert, cooperative, appears stated age and no distress Lochia: appropriate Uterine Fundus: firm DVT Evaluation: No evidence of DVT seen on physical exam. Negative Homan's sign. No cords or calf tenderness. No significant calf/ankle edema.  H/H: Lab Results  Component Value Date/Time   HGB 12.1 07/04/2014 05:00 AM   HCT 36.3 07/04/2014 05:00 AM    Discharge Diagnoses: Term Pregnancy-delivered and PROM x72 hours  Discharge Information: Date: 07/05/2014 Activity: pelvic rest Diet: routine  Medications: Ibuprofen and Colace Breast feeding:   Yes Condition: stable Instructions: refer to handout Discharge to: home Contraception plan: outpatient nexplanon     Medication List    STOP taking these medications        amoxicillin 500 MG capsule  Commonly known as:  AMOXIL     medroxyPROGESTERone 150 MG/ML injection  Commonly known as:  DEPO-PROVERA     naproxen 500 MG tablet  Commonly known as:  NAPROSYN      TAKE these medications        acetaminophen 325 MG tablet  Commonly known as:  TYLENOL  Take 2 tablets (650 mg total) by mouth every 4 (four) hours as needed (for pain scale < 4).     ibuprofen 600 MG tablet  Commonly known as:  ADVIL,MOTRIN  Take 1 tablet (600 mg total) by mouth every 6 (six) hours.     prenatal multivitamin Tabs tablet  Take 1 tablet by mouth daily.     senna-docusate 8.6-50 MG per tablet  Commonly known as:  Senokot-S  Take 1 tablet by mouth at bedtime.        Henson,Amber 07/06/2014 8:06 AM   CNM attestation I have seen and examined this patient and agree with above documentation in the resident's note.   Gabriela Pierce is a 39 y.o. [id number] s/p SVD.   Pain is well controlled.  Plan for birth control is Nexplanon.  Method of Feeding: breast  PE:  BP 100/43 mmHg  Pulse 66  Temp(Src) 98 F (36.7 C) (Oral)  Resp 16  Ht 5\' 2"  (1.575 m)  Wt 72.576 kg (160 lb)  BMI 29.26 kg/m2  SpO2 97%  LMP 09/23/2013  Breastfeeding? Unknown Fundus firm   Recent Labs  07/03/14 1905 07/04/14 0500  HGB 13.0 12.1  HCT 38.2 36.3     Plan: discharge today - postpartum care discussed - f/u H/D in 6 weeks for postpartum visit   SHAW, KIMBERLY, CNM 8:48 AM

## 2014-07-05 NOTE — Progress Notes (Signed)
Post Partum Day 1 Subjective: no complaints, up ad lib, voiding, tolerating PO and + flatus Breastfeeding well, baby is having an [place] today so may not be able to go home  Objective: Blood pressure 112/65, pulse 66, temperature 97.7 F (36.5 C), temperature source Oral, resp. rate 16, height 5\' 2"  (1.575 m), weight 160 lb (72.576 kg), last menstrual period 09/23/2013, SpO2 99 %, unknown if currently breastfeeding.  Physical Exam:  General: alert, cooperative and no distress Lochia: appropriate Uterine Fundus: firm Incision: healing well DVT Evaluation: No evidence of DVT seen on physical exam.   Recent Labs  07/03/14 1905 07/04/14 0500  HGB 13.0 12.1  HCT 38.2 36.3    Assessment/Plan: Plan for discharge tomorrow and Breastfeeding   LOS: 2 days   [facility] 07/05/2014, 7:48 AM

## 2014-07-05 NOTE — Discharge Instructions (Signed)

## 2014-07-05 NOTE — Progress Notes (Signed)
I assisted Michelle,RN with teaching.  [place]  Interpreter.

## 2014-07-05 NOTE — Progress Notes (Signed)
I assisted Michelle,RN with questions.  [place] Interpreter.

## 2014-07-06 MED ORDER — IBUPROFEN 600 MG PO TABS: 600.0000 mg | ORAL_TABLET | Freq: Four times a day (QID) | ORAL | Status: AC

## 2014-07-06 MED ORDER — ACETAMINOPHEN 325 MG PO TABS: 650.0000 mg | ORAL_TABLET | ORAL | Status: AC | PRN

## 2014-07-06 NOTE — Progress Notes (Signed)
I stopped to check on patient's needs and orderd her lunch.  [place]  Interpreter.

## 2014-07-06 NOTE — Progress Notes (Signed)
I assisted RN with some discharges instructions for mom, by Juliann Mule Spanish Interpreter,

## 2014-07-06 NOTE — Progress Notes (Signed)
Teaching and answered questions with interpreter.

## 2015-01-24 ENCOUNTER — Ambulatory Visit: Payer: Medicaid Other

## 2015-01-24 ENCOUNTER — Ambulatory Visit: Payer: Medicaid Other | Attending: Internal Medicine

## 2015-01-25 ENCOUNTER — Ambulatory Visit: Payer: Medicaid Other | Attending: Internal Medicine | Admitting: Internal Medicine

## 2015-01-25 ENCOUNTER — Encounter: Payer: Self-pay | Admitting: Internal Medicine

## 2015-01-25 VITALS — BP 116/80 | HR 88 | Temp 98.0°F | Resp 18 | Ht 61.0 in | Wt 144.6 lb

## 2015-01-25 DIAGNOSIS — N812 Incomplete uterovaginal prolapse: Secondary | ICD-10-CM | POA: Insufficient documentation

## 2015-01-25 DIAGNOSIS — Z79899 Other long term (current) drug therapy: Secondary | ICD-10-CM | POA: Insufficient documentation

## 2015-01-25 DIAGNOSIS — R109 Unspecified abdominal pain: Secondary | ICD-10-CM | POA: Insufficient documentation

## 2015-01-25 DIAGNOSIS — R102 Pelvic and perineal pain: Secondary | ICD-10-CM | POA: Insufficient documentation

## 2015-01-25 MED ORDER — ACETAMINOPHEN-CODEINE #3 300-30 MG PO TABS: 1.0000 | ORAL_TABLET | ORAL | Status: AC | PRN

## 2015-01-25 NOTE — Progress Notes (Signed)
Patient ID: Gabriela Pierce, female   DOB: [birth year], 39 y.o.   MRN: [id number]   Gabriela Pierce, is a 39 y.o. female  [id number]  [id number]  DOB - 06-17-1975  Chief Complaint  Patient presents with  . Abdominal Pain        Subjective:   Gabriela Pierce is a 39 y.o. female here today for a follow up visit. Patient complains of abdominal and pelvic pain, present for two months, since last Wednesday pain has been constant, pain is scaled at 5 now, but when intense, pain is scaled at an 8. Patient states Tylenol and lays down which reliefs the pain. She has no urinary of fecal incontinence, no fever, no vaginal discharge but has an on and off vaginal odor. Patient has No headache, No chest pain, No abdominal pain - No Nausea, No new weakness tingling or numbness, No Cough - SOB.  Problem  Uterovaginal Prolapse, Incomplete    ALLERGIES: No Known Allergies  PAST MEDICAL HISTORY: Past Medical History  Diagnosis Date  . Gestational diabetes     MEDICATIONS AT HOME: Prior to Admission medications   Medication Sig Start Date End Date Taking? Authorizing Provider  acetaminophen (TYLENOL) 325 MG tablet Take 2 tablets (650 mg total) by mouth every 4 (four) hours as needed (for pain scale < 4). 07/06/14  Yes Renato Shin, MD  ibuprofen (ADVIL,MOTRIN) 600 MG tablet Take 1 tablet (600 mg total) by mouth every 6 (six) hours. 07/06/14  Yes Renato Shin, MD  Prenatal Vit-Fe Fumarate-FA (PRENATAL MULTIVITAMIN) TABS Take 1 tablet by mouth daily.   Yes Historical Provider, MD  acetaminophen-codeine (TYLENOL #3) 300-30 MG tablet Take 1 tablet by mouth every 4 (four) hours as needed. 01/25/15   Tresa Garter, MD  senna-docusate (SENOKOT-S) 8.6-50 MG per tablet Take 1 tablet by mouth at bedtime. Patient not taking: Reported on 01/25/2015 07/05/14   Lorna Few, DO     Objective:   Filed Vitals:   01/25/15 1551  BP: 116/80  Pulse: 88  Temp: 98 F (36.7 C)    TempSrc: Oral  Resp: 18  Height: 5\' 1"  (1.549 m)  Weight: 144 lb 9.6 oz (65.59 kg)  SpO2: 98%    Exam General appearance : Awake, alert, not in any distress. Speech Clear. Not toxic looking HEENT: Atraumatic and Normocephalic, pupils equally reactive to light and accomodation Neck: supple, no JVD. No cervical lymphadenopathy.  Chest:Good air entry bilaterally, no added sounds  CVS: S1 S2 regular, no murmurs.  Abdomen: Bowel sounds present, Non tender and not distended with no gaurding, rigidity or rebound. Extremities: B/L Lower Ext shows no edema, both legs are warm to touch Neurology: Awake alert, and oriented X 3, CN II-XII intact, Non focal  Pelvic Exam: Cervix normal in appearance, +Cystocele, +uterovaginal prolapse first degree   Data Review No results found for: HGBA1C   Assessment & Plan   1. Uterovaginal prolapse, incomplete  - acetaminophen-codeine (TYLENOL #3) 300-30 MG tablet; Take 1 tablet by mouth every 4 (four) hours as needed.  Dispense: 60 tablet; Refill: 0  - Ambulatory referral to Gynecology  Patient have been counseled extensively about nutrition and exercise  Return in about 6 months (around 07/26/2015) for Follow up Pain and comorbidities.  The patient was given clear instructions to go to ER or return to medical center if symptoms don't improve, worsen or new problems develop. The patient verbalized understanding. The patient was told to call to get lab results if they haven't  heard anything in the next week.   This note has been created with Surveyor, quantity. Any transcriptional errors are unintentional.    Angelica Chessman, MD, [place], [place], [place], [place] and [place], [place]   01/25/2015, 5:03 PM

## 2015-01-25 NOTE — Progress Notes (Signed)
[id number] Gabriela Pierce  Patient complains of abd pain and pelvic bone. Pain has been present for two months, last Wednesday pain as been constant, pain is scaled at 5, intense pain is scaled at an 8. Patient states Tylenol and lays down which subsides pain.

## 2015-01-25 NOTE — Patient Instructions (Signed)
Prolapso de los rganos de la pelvis (Pelvic Organ Prolapse) El prolapso de los rganos de la pelvis es el estiramiento, la dilatacin o la cada de los rganos de la pelvis a una posicin anormal. Esto sucede cuando los msculos y tejidos que rodean y sostienen las estructuras plvicas se estiran o se debilitan. El prolapso de los rganos de la pelvis puede involucrar los siguientes rganos:  Vagina (prolapso vaginal).  tero (prolapso uterino).  Vejiga (cistocele).  Recto (rectocele).  Intestinos (enterocele). Cuando estn comprometidos rganos que no son la vagina, estos a menudo se protruyen hacia adentro o hacia afuera de la vagina, dependiendo de la gravedad del prolapso. CAUSAS Algunas causas de esta enfermedad incluyen lo siguiente:  [place] y [place].  Tos prolongada (crnica).  Estreimiento crnico.  Obesidad.  Ciruga de pelvis anterior.  Envejecimiento. Durante y despus de la menopausia, una disminucin en la produccin de Therapist, occupational puede debilitar los msculos y los ligamentos plvicos.  Levantar sistemticamente objetos pesados de ms de 50libras (23kg).  Acumulacin de lquido en el abdomen debido a ciertas enfermedades y a Media planner. SNTOMAS Los sntomas de esta afeccin incluyen lo siguiente:  Prdida del control de la vejiga al toser, Brewing technologist, hacer un esfuerzo y Field seismologist ejercicio (incontinencia urinaria de esfuerzo). Esto puede empeorar inmediatamente despus del nacimiento y Teacher, English as a foreign language gradualmente con Physiological scientist.  Sensacin de presin en la pelvis o la vagina. Esta presin puede aumentar al toser o defecar.  Una protuberancia que sobresale desde la apertura de la vagina o contra la pared vaginal. Si el tero sobresale a travs de la apertura de la vagina y roza la ropa, tambin puede padecer irritacin, lceras, infeccin, dolor y [place].  Mayor esfuerzo para Systems developer.  Dolor en la parte inferior de la  espalda.  Dolor, molestias o Midwife.  Infecciones reiteradas en la vejiga (infecciones en las vas urinarias).  Dificultad o incapacidad para colocar un tampn o un aplicador. En algunas personas, esta afeccin no produce sntomas. DIAGNSTICO El mdico puede realizarle un examen interno y externo de la vagina y el recto. Durante el examen, puede pedirle que tosa y que haga un esfuerzo mientras est [place], sentada y de pie. El mdico determinar si se requieren ms estudios, como las pruebas de la funcin de la vejiga. TRATAMIENTO En la [name], esta afeccin debe tratarse solo si produce sntomas. No existe ningn tratamiento que garantice que el prolapso se corregir o que alivie los sntomas por completo. El tratamiento puede incluir lo siguiente:  Cambios de estilo de vida tales como:  Product/process development scientist las bebidas que contengan cafena.  Aumentar la ingesta de alimentos con alto contenido de [place]. Esto puede ayudar a Transport planner estreimiento y Arboriculturist.  Vaciar la vejiga en momentos programados (terapia de entrenamiento de la vejiga). Esto puede ayudar a reducir o Mining engineer incontinencia urinaria.  Bajar de peso si tiene sobrepeso o es obeso.  Estrgeno. Si el prolapso es leve, el estrgeno puede ayudar al aumentar la fuerza y el tono de los msculos del piso plvico.  Ejercicios de Kegel. Estos ejercicios pueden ayudar en los casos leves de prolapso al estirar y tensar los msculos del piso plvico.  Colocacin de un pesario. Un pesario es un dispositivo blando y flexible que el mdico coloca en la vagina para ayudar a Nature conservation officer las paredes vaginales y Theatre manager los rganos de la pelvis en su Environmental consultant.  Ciruga. Esta es a menudo  la nica forma de tratamiento de los casos graves de prolapso. Existen diferentes tipos de [place] disponibles. INSTRUCCIONES PARA EL CUIDADO EN EL HOGAR  Use una toalla higinica o un producto absorbente  si tiene incontinencia urinaria.  Evite levantar objetos pesados y Armed forces training and education officer se ejercita o trabaja. No contenga la respiracin cuando levanta pesas y realiza ejercicios de intensidad leve a moderada. Limite sus actividades segn las indicaciones del [place] los medicamentos solamente como se lo haya indicado el mdico.  Practique los ejercicios de Kegel como se lo haya indicado el mdico.  Si tiene un pesario, selo como se lo haya indicado el mdico. SOLICITE ATENCIN MDICA SI:  Sus sntomas interfieren con sus actividades diarias o su vida sexual.  Necesita medicamentos para Public house manager las [place].  Observa sangrado vaginal no relacionado con su menstruacin.  Tiene fiebre.  Siente dolor o tiene una hemorragia al [employer].  Observa sangrado al defecar.  Pierde orina durante las [employer].  Sufre estreimiento crnico.  Tiene un pesario colocado y Radio broadcast assistant.  Tiene secrecin vaginal con olor ftido.  Tiene dolor o clicos inusuales en la parte inferior del abdomen.   Esta informacin no tiene Marine scientist el consejo del mdico. Asegrese de hacerle al mdico cualquier pregunta que tenga.   Document Released: 10/26/2013 Elsevier Interactive Patient Education [employer].

## 2015-02-13 ENCOUNTER — Ambulatory Visit: Payer: Medicaid Other | Admitting: Family Medicine

## 2015-02-19 ENCOUNTER — Ambulatory Visit: Payer: Medicaid Other | Attending: Internal Medicine

## 2015-03-02 ENCOUNTER — Ambulatory Visit (INDEPENDENT_AMBULATORY_CARE_PROVIDER_SITE_OTHER): Payer: Self-pay | Admitting: Obstetrics & Gynecology

## 2015-03-02 ENCOUNTER — Encounter: Payer: Self-pay | Admitting: Obstetrics & Gynecology

## 2015-03-02 VITALS — BP 140/85 | HR 74 | Temp 97.6°F | Ht 60.0 in | Wt 147.2 lb

## 2015-03-02 DIAGNOSIS — R300 Dysuria: Secondary | ICD-10-CM

## 2015-03-02 DIAGNOSIS — R102 Pelvic and perineal pain: Secondary | ICD-10-CM

## 2015-03-02 DIAGNOSIS — N9489 Other specified conditions associated with female genital organs and menstrual cycle: Secondary | ICD-10-CM

## 2015-03-02 DIAGNOSIS — R319 Hematuria, unspecified: Secondary | ICD-10-CM

## 2015-03-02 DIAGNOSIS — N898 Other specified noninflammatory disorders of vagina: Secondary | ICD-10-CM

## 2015-03-02 LAB — POCT URINALYSIS DIP (DEVICE)
BILIRUBIN URINE: NEGATIVE
GLUCOSE, UA: NEGATIVE mg/dL
KETONES UR: NEGATIVE mg/dL
Leukocytes, UA: NEGATIVE
Nitrite: NEGATIVE
PROTEIN: NEGATIVE mg/dL
SPECIFIC GRAVITY, URINE: 1.025 (ref 1.005–1.030)
Urobilinogen, UA: 0.2 mg/dL (ref 0.0–1.0)
pH: 6 (ref 5.0–8.0)

## 2015-03-02 LAB — FOLLICLE STIMULATING HORMONE: FSH: 4.5 m[IU]/mL

## 2015-03-02 NOTE — Progress Notes (Signed)
   Subjective:    Patient ID: Gabriela Pierce, female    DOB: Sep 11, 1975, 39 y.o.   MRN: [id number]  HPI 39 yo SHP4 is here with a laundry list of complaints. 1) recurrent fishy odor 2) was told her bladder is dropped 3) dysuria with recurrent blood seen in urine in urinalysis 4) vaginal dryness with sex 5) 2 year h/o midpelvic pain "like menstrual cramps", about once per week, resolves with IBU   Review of Systems She reports a normal pap at the health dept, UTD She has had the Nexplanon in for about a year    Objective:   Physical Exam  WNWHHFNAD (interpretor used) Breathing, conversing, and ambulating normally Abd- benign EG, vagina, discharge all normal, no odor noted NSSA, NT, mobile, no adnexal masses She has first degree uterine and bladder prolapse      Assessment & Plan:  Vaginal dryness- check [facility] Fishy odor appreciated by patient- check wet prep 1st degree prolapse- reassurance given Recurrent hematuria- refer to urology. Urine culture sent Pelvic pain- check gyn u/s RTC 1 month

## 2015-03-02 NOTE — Progress Notes (Signed)
Pt states that she has vaginal dryness was advised to use lubricant but still not effective.  C/o sometimes fishy odor with no vaginal discharge.

## 2015-03-03 LAB — WET PREP, GENITAL
Clue Cells Wet Prep HPF POC: NONE SEEN
TRICH WET PREP: NONE SEEN
YEAST WET PREP: NONE SEEN

## 2015-03-04 LAB — URINE CULTURE
Colony Count: NO GROWTH
Organism ID, Bacteria: NO GROWTH

## 2015-03-09 ENCOUNTER — Ambulatory Visit (HOSPITAL_COMMUNITY)
Admission: RE | Admit: 2015-03-09 | Discharge: 2015-03-09 | Disposition: A | Discharge status: Home / Self Care | Payer: Self-pay | Source: Ambulatory Visit | Attending: Obstetrics & Gynecology | Admitting: Obstetrics & Gynecology

## 2015-03-09 DIAGNOSIS — R102 Pelvic and perineal pain: Secondary | ICD-10-CM | POA: Insufficient documentation

## 2015-03-09 DIAGNOSIS — D251 Intramural leiomyoma of uterus: Secondary | ICD-10-CM | POA: Insufficient documentation

## 2015-03-20 ENCOUNTER — Telehealth: Payer: Self-pay | Admitting: General Practice

## 2015-03-20 NOTE — Telephone Encounter (Signed)
Patient called and left message stating she is calling for her results about an infection. Called patient with [place] for interpreter, no answer- left message stating we are trying to reach you to return your phone call, please call us back at the clinics

## 2015-03-21 ENCOUNTER — Other Ambulatory Visit: Payer: Self-pay

## 2015-03-21 DIAGNOSIS — N8110 Cystocele, unspecified: Secondary | ICD-10-CM

## 2015-03-21 NOTE — Telephone Encounter (Signed)
Called pt with Spanish interpreter Verdis Frederickson, and informed pt that she does not have a UTI and her wet prep resulted negative.  I asked pt if she has made an appt for urology pt stated that she could not afford the services.  Pt is aware to f/u with her PCP, CHW.

## 2015-03-21 NOTE — Progress Notes (Signed)
Called pt with [place], and informed pt of Residency Urology Program in [facility] that will see self pay pt's.  Pt stated that she is willing to drive to [facility].  Scheduled appt with Urology Residency Program at [facility], [phone], for December 14th @ 1430.  I gave pt info as follows:  [place] [facility] [address], and that the office will send her information in the mail.  Pt stated understanding with no further questions.  Sent referral request via Epic and front office to fax demographics, [place], and office visit to # [phone].

## 2015-04-02 ENCOUNTER — Ambulatory Visit: Payer: Self-pay | Admitting: Obstetrics & Gynecology

## 2015-04-25 ENCOUNTER — Ambulatory Visit: Payer: Self-pay | Admitting: Obstetrics & Gynecology

## 2015-04-30 ENCOUNTER — Encounter: Payer: Self-pay | Admitting: Obstetrics & Gynecology

## 2015-04-30 ENCOUNTER — Ambulatory Visit (INDEPENDENT_AMBULATORY_CARE_PROVIDER_SITE_OTHER): Payer: Self-pay | Admitting: Obstetrics & Gynecology

## 2015-04-30 VITALS — BP 124/86 | HR 76 | Wt 147.6 lb

## 2015-04-30 DIAGNOSIS — R102 Pelvic and perineal pain: Secondary | ICD-10-CM

## 2015-05-02 NOTE — Progress Notes (Signed)
   Subjective:    Patient ID: Gabriela Pierce, female    DOB: 1975/11/19, 40 y.o.   MRN: [id number]  HPI  40 yo H lady here for the results of her gyn u/s. She has a long h/o pelvic pain.   Review of Systems She has an appt with the urologist.    Objective:   Physical Exam WNWHHFNAD Breathing, conversing, and ambulating normally Her u/s was essentially normal. She has a 2 cm fibroid.     Assessment & Plan:  Pelvic pain- I offered her a diagnostic laparoscopy prn

## 2020-07-11 ENCOUNTER — Other Ambulatory Visit: Payer: Self-pay | Admitting: Obstetrics & Gynecology

## 2020-07-11 DIAGNOSIS — Z1231 Encounter for screening mammogram for malignant neoplasm of breast: Secondary | ICD-10-CM

## 2020-08-09 ENCOUNTER — Ambulatory Visit
Admission: RE | Admit: 2020-08-09 | Discharge: 2020-08-09 | Disposition: A | Discharge status: Home / Self Care | Payer: No Typology Code available for payment source | Source: Ambulatory Visit | Attending: Obstetrics & Gynecology | Admitting: Obstetrics & Gynecology

## 2020-08-09 ENCOUNTER — Other Ambulatory Visit: Payer: Self-pay

## 2020-08-09 DIAGNOSIS — Z1231 Encounter for screening mammogram for malignant neoplasm of breast: Secondary | ICD-10-CM

## 2023-02-06 IMAGING — MG MM DIGITAL SCREENING BILAT W/ TOMO AND CAD
8 series · 8 of 24 positions shown · non-contrast
Comparison: None.

CLINICAL DATA: Screening.

EXAM:
DIGITAL SCREENING BILATERAL MAMMOGRAM WITH TOMOSYNTHESIS AND CAD
TECHNIQUE: Bilateral screening digital craniocaudal and mediolateral oblique
mammograms were obtained. Bilateral screening digital breast
tomosynthesis was performed. The images were evaluated with
computer-aided detection.

[R CC synth-2D]
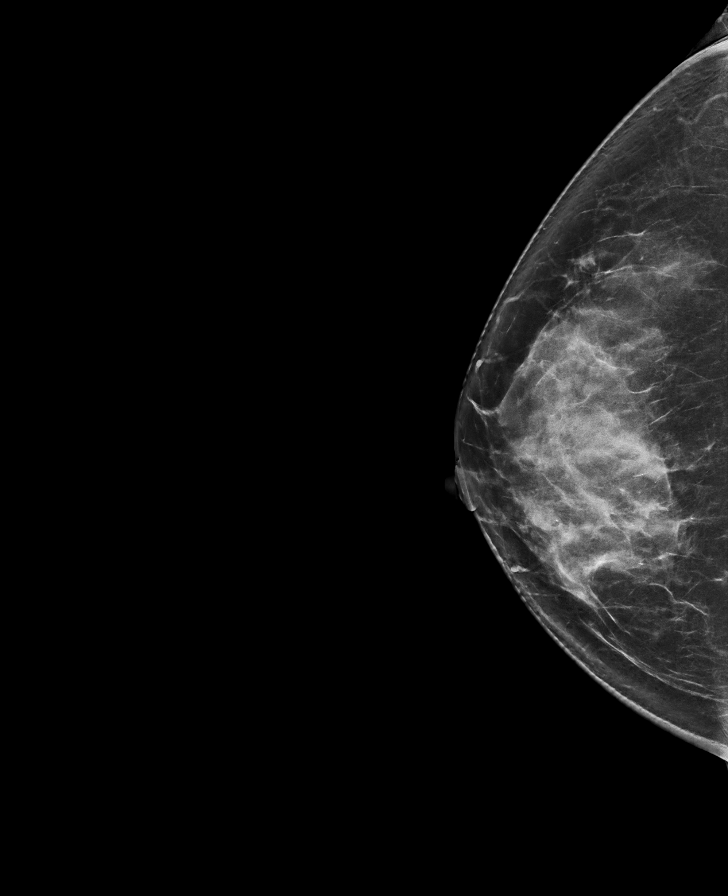

[R MLO synth-2D]
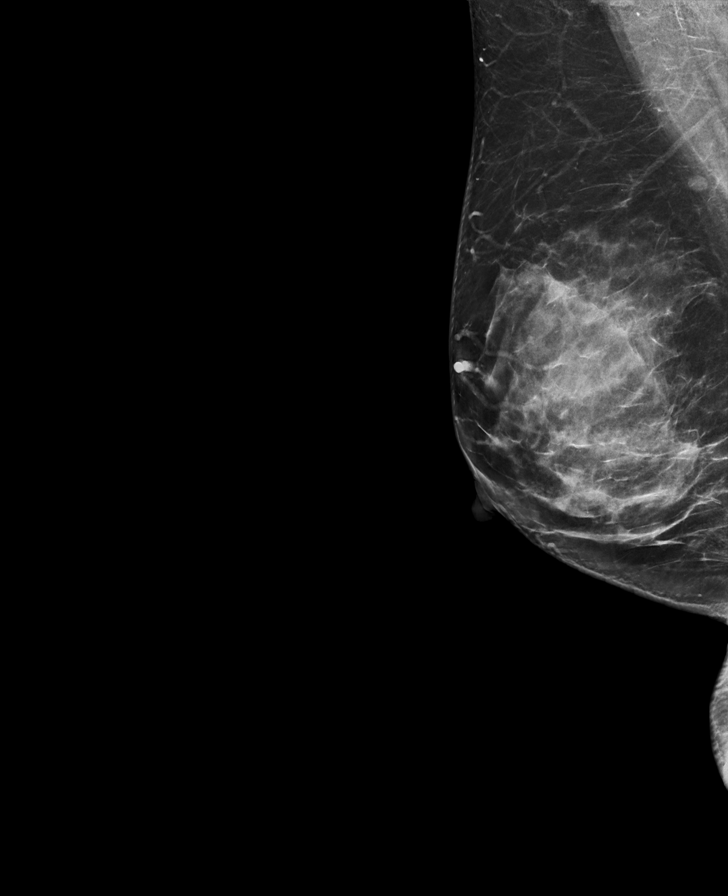

[L CC synth-2D]
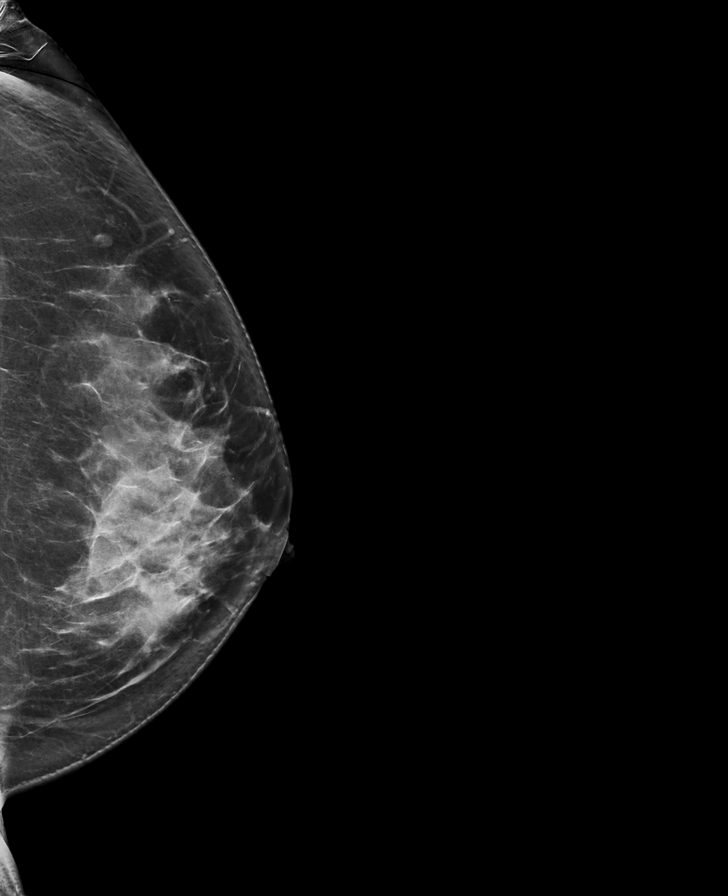

[L MLO synth-2D]
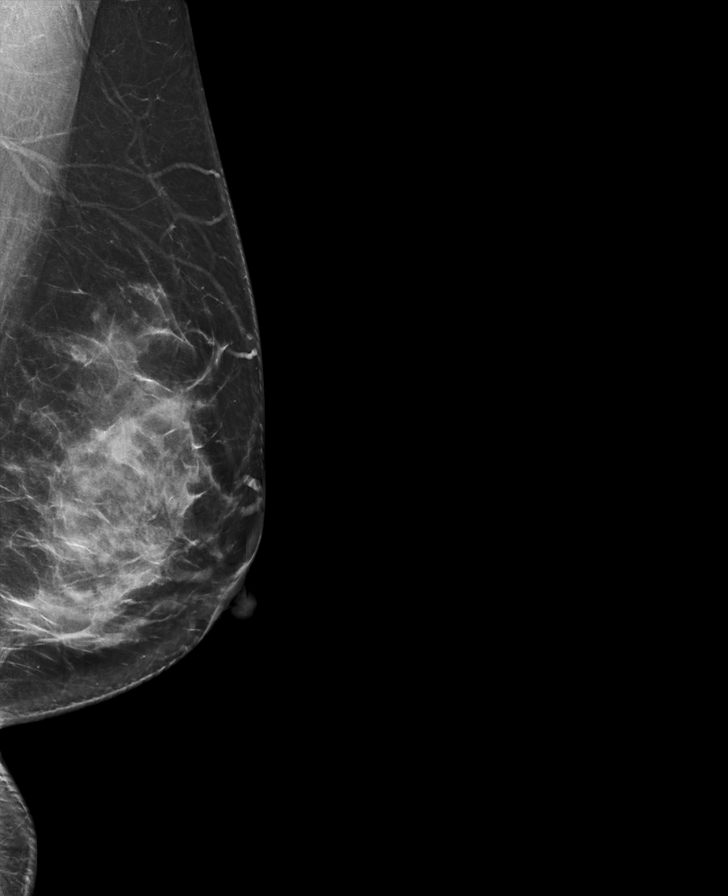

[R CC tomo · tomo slice 41/82.0]
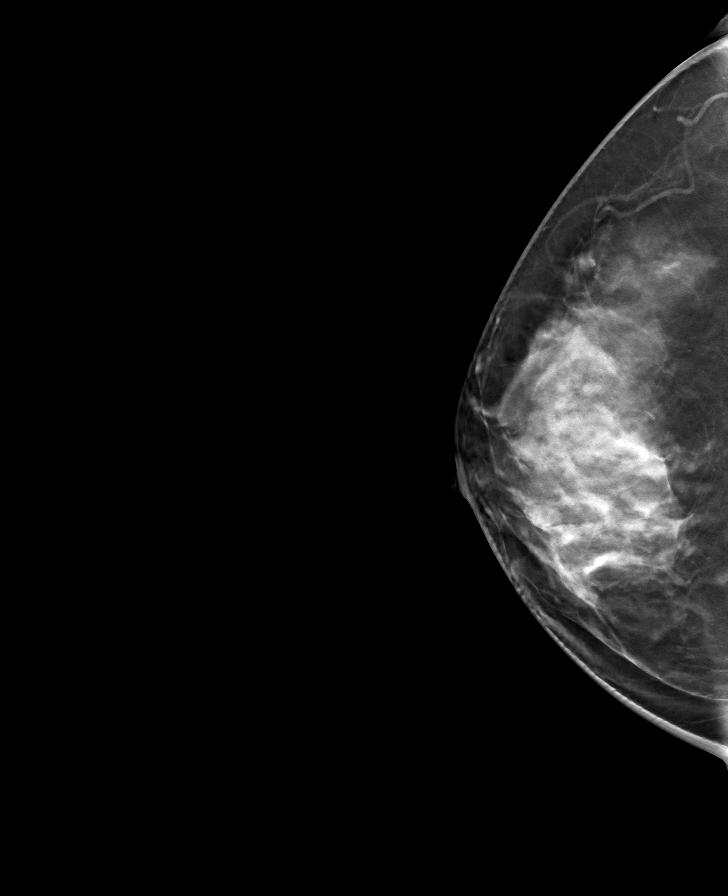

[L CC tomo · tomo slice 45/88.0]
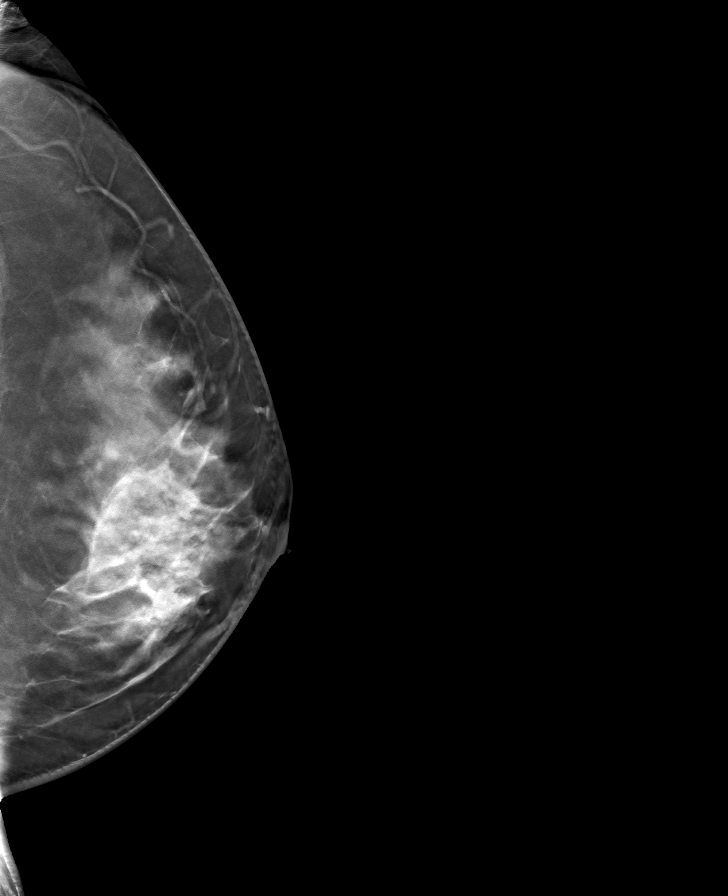

[L MLO tomo · tomo slice 43/85.0]
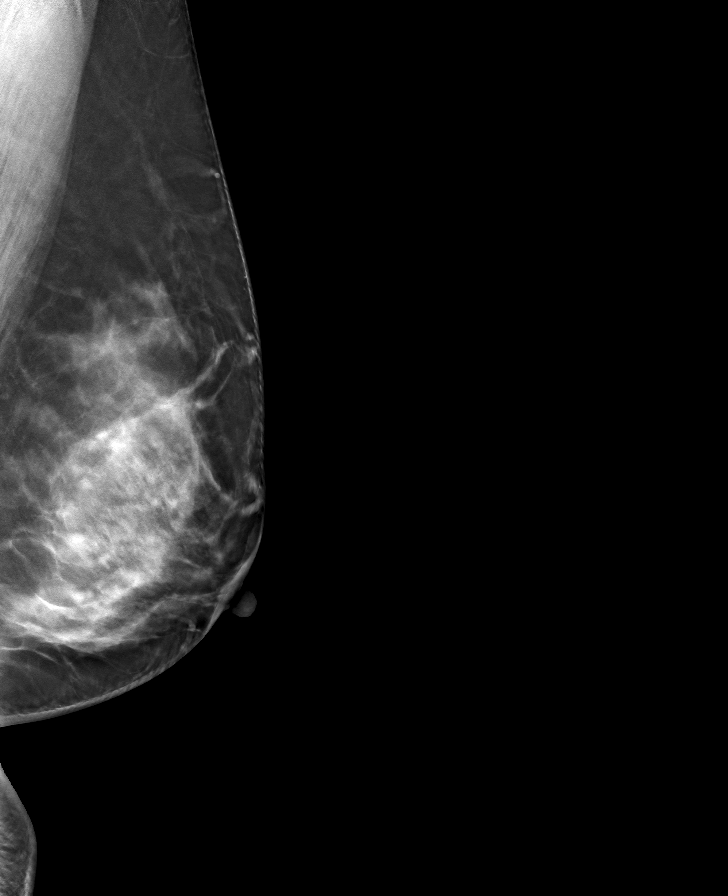

[R MLO tomo · tomo slice 42/83.0]
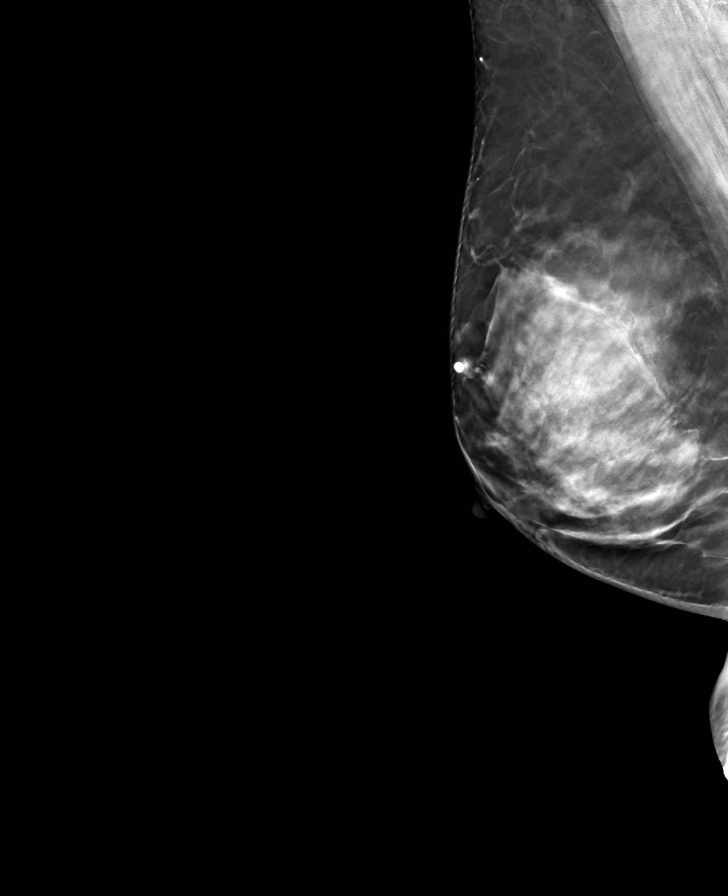

[8 of 24 positions shown; findings below may reference images not displayed]

ACR Breast Density Category c: The breast tissue is heterogeneously
dense, which may obscure small masses
FINDINGS: There are no findings suspicious for malignancy. The images were
evaluated with computer-aided detection.
IMPRESSION: No mammographic evidence of malignancy. A result letter of this
screening mammogram will be mailed directly to the patient.

RECOMMENDATION:
Screening mammogram in one year. (Code:CQ-1-UDH)

BI-RADS CATEGORY  1: Negative.
